# Patient Record
Sex: Male | Born: 1955 | Race: White | Hispanic: No | Marital: Single | Smoking: Current every day smoker
Health system: Southern US, Community
[De-identification: ages and names within clinical notes are randomized; demographics above are authoritative.]

## PROBLEM LIST (undated history)

## (undated) DIAGNOSIS — E119 Type 2 diabetes mellitus without complications: Secondary | ICD-10-CM

## (undated) DIAGNOSIS — Z720 Tobacco use: Secondary | ICD-10-CM

## (undated) HISTORY — PX: CHEST TUBE INSERTION: SHX231

---

## 2013-06-14 DIAGNOSIS — S270XXA Traumatic pneumothorax, initial encounter: Secondary | ICD-10-CM

## 2013-06-14 HISTORY — DX: Traumatic pneumothorax, initial encounter: S27.0XXA

## 2013-08-29 ENCOUNTER — Encounter (HOSPITAL_COMMUNITY): Payer: Self-pay | Admitting: Emergency Medicine

## 2013-08-29 ENCOUNTER — Inpatient Hospital Stay (HOSPITAL_COMMUNITY)
Admission: EM | Admit: 2013-08-29 | Discharge: 2013-09-01 | DRG: 200 | Disposition: A | Discharge status: Home / Self Care | Payer: Self-pay | Attending: General Surgery | Admitting: General Surgery

## 2013-08-29 ENCOUNTER — Emergency Department (HOSPITAL_COMMUNITY): Payer: Self-pay

## 2013-08-29 DIAGNOSIS — E119 Type 2 diabetes mellitus without complications: Secondary | ICD-10-CM | POA: Diagnosis present

## 2013-08-29 DIAGNOSIS — F17200 Nicotine dependence, unspecified, uncomplicated: Secondary | ICD-10-CM | POA: Diagnosis present

## 2013-08-29 DIAGNOSIS — S2239XA Fracture of one rib, unspecified side, initial encounter for closed fracture: Secondary | ICD-10-CM

## 2013-08-29 DIAGNOSIS — S32009A Unspecified fracture of unspecified lumbar vertebra, initial encounter for closed fracture: Secondary | ICD-10-CM | POA: Diagnosis present

## 2013-08-29 DIAGNOSIS — E785 Hyperlipidemia, unspecified: Secondary | ICD-10-CM

## 2013-08-29 DIAGNOSIS — Z720 Tobacco use: Secondary | ICD-10-CM

## 2013-08-29 DIAGNOSIS — W19XXXA Unspecified fall, initial encounter: Secondary | ICD-10-CM | POA: Diagnosis present

## 2013-08-29 DIAGNOSIS — S2249XA Multiple fractures of ribs, unspecified side, initial encounter for closed fracture: Secondary | ICD-10-CM | POA: Diagnosis present

## 2013-08-29 DIAGNOSIS — J930 Spontaneous tension pneumothorax: Secondary | ICD-10-CM

## 2013-08-29 DIAGNOSIS — S270XXA Traumatic pneumothorax, initial encounter: Principal | ICD-10-CM | POA: Diagnosis present

## 2013-08-29 DIAGNOSIS — W1789XA Other fall from one level to another, initial encounter: Secondary | ICD-10-CM | POA: Diagnosis present

## 2013-08-29 DIAGNOSIS — S2242XA Multiple fractures of ribs, left side, initial encounter for closed fracture: Secondary | ICD-10-CM | POA: Diagnosis present

## 2013-08-29 LAB — BASIC METABOLIC PANEL
BUN: 10 mg/dL (ref 6–23)
CHLORIDE: 100 meq/L (ref 96–112)
CO2: 24 mEq/L (ref 19–32)
Calcium: 9.2 mg/dL (ref 8.4–10.5)
Creatinine, Ser: 0.61 mg/dL (ref 0.50–1.35)
Glucose, Bld: 178 mg/dL — ABNORMAL HIGH (ref 70–99)
POTASSIUM: 3.9 meq/L (ref 3.7–5.3)
SODIUM: 139 meq/L (ref 137–147)

## 2013-08-29 LAB — CBC WITH DIFFERENTIAL/PLATELET
BASOS ABS: 0 10*3/uL (ref 0.0–0.1)
Basophils Relative: 0 % (ref 0–1)
EOS ABS: 0.1 10*3/uL (ref 0.0–0.7)
Eosinophils Relative: 1 % (ref 0–5)
HCT: 49.6 % (ref 39.0–52.0)
HEMOGLOBIN: 17.4 g/dL — AB (ref 13.0–17.0)
LYMPHS PCT: 11 % — AB (ref 12–46)
Lymphs Abs: 0.9 10*3/uL (ref 0.7–4.0)
MCH: 31.5 pg (ref 26.0–34.0)
MCHC: 35.1 g/dL (ref 30.0–36.0)
MCV: 89.9 fL (ref 78.0–100.0)
Monocytes Absolute: 0.6 10*3/uL (ref 0.1–1.0)
Monocytes Relative: 7 % (ref 3–12)
Neutro Abs: 6.7 10*3/uL (ref 1.7–7.7)
Neutrophils Relative %: 81 % — ABNORMAL HIGH (ref 43–77)
PLATELETS: ADEQUATE 10*3/uL (ref 150–400)
RBC: 5.52 MIL/uL (ref 4.22–5.81)
RDW: 12.8 % (ref 11.5–15.5)
WBC: 8.3 10*3/uL (ref 4.0–10.5)

## 2013-08-29 LAB — I-STAT CG4 LACTIC ACID, ED: LACTIC ACID, VENOUS: 1.64 mmol/L (ref 0.5–2.2)

## 2013-08-29 MED ORDER — FENTANYL CITRATE 0.05 MG/ML IJ SOLN
100.0000 ug | Freq: Once | INTRAMUSCULAR | Status: AC
  Administered 2013-08-29: 100 ug via INTRAVENOUS
  Administered 2013-08-30 (×2): 50 ug via INTRAVENOUS
  Filled 2013-08-29: qty 2

## 2013-08-29 MED ORDER — FENTANYL CITRATE 0.05 MG/ML IJ SOLN
INTRAMUSCULAR | Status: AC
  Administered 2013-08-30: 50 ug via INTRAVENOUS
  Filled 2013-08-29: qty 2

## 2013-08-29 MED ORDER — HYDROMORPHONE HCL PF 1 MG/ML IJ SOLN
1.0000 mg | Freq: Once | INTRAMUSCULAR | Status: AC
  Administered 2013-08-29: 1 mg via INTRAVENOUS
  Filled 2013-08-29: qty 1

## 2013-08-29 MED ORDER — MIDAZOLAM HCL 2 MG/2ML IJ SOLN
INTRAMUSCULAR | Status: AC
  Administered 2013-08-30: 2 mg
  Filled 2013-08-29: qty 8

## 2013-08-29 NOTE — ED Notes (Signed)
Patient transported to CT 

## 2013-08-29 NOTE — ED Notes (Signed)
PT reports to [facility] HE fell approx 4 ft . And hit back of wood planter. Pt reported to EMS that he hurt to bad to go on LSB ( Pt refused LSB). [facility] gave 250 of fentanyl for pain on arrival to ED Pt reported Pain to be 2/10 . Pt demonstrated full range of motion with bilateral LE. Pulses strong Pedal. Pt denied LOC due to Fall.

## 2013-08-29 NOTE — ED Provider Notes (Signed)
~  2200 I was called  to the patient's room due to consistent pain, and after initial x-rays demonstrated pneumothorax, multiple rib fractures. On my exam the patient was speaking clearly not substantially hypoxic, not hypotensivebut w increased respiratory rate and shallow breathing.  He did complain of ongoing pain and dyspnea - and seemed to be favoring his L side. I ordered CT scans of the patient's injuries for further evaluation.    11:44 PM On re-exam the patient appears comfortable, he is now in the trauma bay, given the evidence of pneumothorax on x-ray and CT. I discussed his case with our trauma surgeon on call, Dr. Corliss Skainssuei, who will assist with the patient's care, including ED chest tube placement.  Vital signs remain similar, with no tachycardia, no hypoxia, no hypotension, though he is mildly tachypneic.  EKG shows sinus rhythm, rate 79, no ischemic changes, normal  Patient returned from CT to Trauma Bay B   12:59 AM Following placement of the chest tube the patient is feeling better.  VS have improved w no more tachypnea. He continues to have pain, but his respirations are easier.        Derek Munchobert Dariyon Urquilla, MD 08/30/13 [phone]

## 2013-08-29 NOTE — ED Provider Notes (Signed)
2000 - Patient care assumed from Roxy Horsemanobert Browning, PA-C at shift change. Patient fell from height of 674ft yesterday off patio at 1PM yesterday. Patient fell on back, hitting back on edges of rectangular vegetable planter. Imaging pending.  2025 - Patient refuses to lay flat while at Xray limiting back films to 2 view only, rather than complete. Xray called to inform me of this. Have requested that radiology tech explain to the patient the potential repercussions of this including, but not limited to permanent disability or injury. Radiology tech verbalizes to me that patient expresses understanding to this effect.  2150 - Radiologist called to report findings on CXR which include multiple posterior L rib fractures with L pneumothorax. Patient with sats of 88% on RA on arrival. Patient is in no acute respiratory distress. No tachycardia. He has been placed on 3L O2 [place] given PTX and O2 sats of 88%; he is now maintaining O2 sats of 93%. CT C/T/L spine and CT chest ordered. Plan includes admission to trauma service.   2335 - CT chest reveals tension PTX, per radiologist reading which was just verbally reported to me. Multiple L rib fractures, again, identified. Dr. Jeraldine LootsLockwood notified who will consult with trauma surgeon.  2345 - Trauma to admit.  Antony MaduraKelly Rashmi Tallent, PA-C 08/29/13 2349  Results for orders placed during the hospital encounter of 08/29/13  CBC WITH DIFFERENTIAL      Result Value Ref Range   WBC 8.3  4.0 - 10.5 K/uL   RBC 5.52  4.22 - 5.81 MIL/uL   Hemoglobin 17.4 (*) 13.0 - 17.0 g/dL   HCT 09.849.6  11.939.0 - 14.752.0 %   MCV 89.9  78.0 - 100.0 fL   MCH 31.5  26.0 - 34.0 pg   MCHC 35.1  30.0 - 36.0 g/dL   RDW 82.912.8  56.211.5 - 13.015.5 %   Platelets    150 - 400 K/uL   Value: PLATELET CLUMPS NOTED ON SMEAR, COUNT APPEARS ADEQUATE   Neutrophils Relative % 81 (*) 43 - 77 %   Lymphocytes Relative 11 (*) 12 - 46 %   Monocytes Relative 7  3 - 12 %   Eosinophils Relative 1  0 - 5 %   Basophils Relative 0  0 - 1  %   Neutro Abs 6.7  1.7 - 7.7 K/uL   Lymphs Abs 0.9  0.7 - 4.0 K/uL   Monocytes Absolute 0.6  0.1 - 1.0 K/uL   Eosinophils Absolute 0.1  0.0 - 0.7 K/uL   Basophils Absolute 0.0  0.0 - 0.1 K/uL  BASIC METABOLIC PANEL      Result Value Ref Range   Sodium 139  137 - 147 mEq/L   Potassium 3.9  3.7 - 5.3 mEq/L   Chloride 100  96 - 112 mEq/L   CO2 24  19 - 32 mEq/L   Glucose, Bld 178 (*) 70 - 99 mg/dL   BUN 10  6 - 23 mg/dL   Creatinine, Ser 8.650.61  0.50 - 1.35 mg/dL   Calcium 9.2  8.4 - 78.410.5 mg/dL   GFR calc non Af Amer >90  >90 mL/min   GFR calc Af Amer >90  >90 mL/min  I-STAT CG4 LACTIC ACID, ED      Result Value Ref Range   Lactic Acid, Venous 1.64  0.5 - 2.2 mmol/L   Dg Ribs Unilateral W/chest Left  08/29/2013   CLINICAL DATA:  Fall.  EXAM: LEFT RIBS AND CHEST - 3+ VIEW  COMPARISON:  None.  FINDINGS: Multiple left posterior rib fractures are noted with a prominent left pneumothorax.  IMPRESSION: Multiple posterior left rib fractures with prominent left-sided pneumothorax. These results were called by telephone at the time of interpretation on 08/29/2013 at 9:45 PM to [facility] , who verbally acknowledged these results.   Electronically Signed   By: Maisie Fus  Register   On: 08/29/2013 21:46   Dg Cervical Spine 1 View  08/29/2013   CLINICAL DATA:  Fall.  EXAM: DG CERVICAL SPINE - 1 VIEW  COMPARISON:  None.  FINDINGS: Single cross-table lateral view of the cervical spine demonstrates only to the C4 level. Cannot visualize below this level due to overlying shoulders. Alignment in the upper cervical spine is normal. Prevertebral soft tissues normal.  IMPRESSION: Severely limited clearing view, demonstrating to the superior aspect of C4. The remainder of the cervical spine is obscured due to overlying shoulders.   Electronically Signed   By: Charlett Nose M.D.   On: 08/29/2013 21:41   Dg Thoracic Spine 2 View  08/29/2013   CLINICAL DATA:  Fall.  EXAM: THORACIC SPINE - 2 VIEW  COMPARISON:  Diffuse  degenerative changes thoracic spine. No acute abnormality.  FINDINGS: There is no evidence of thoracic spine fracture. Alignment is normal. No other significant bone abnormalities are identified.  IMPRESSION: Diffuse degenerative change thoracic spine, no acute abnormality.   Electronically Signed   By: Maisie Fus  Register   On: 08/29/2013 21:41   Dg Lumbar Spine 2-3 Views  08/29/2013   CLINICAL DATA:  Fall.  EXAM: LUMBAR SPINE - 2-3 VIEW  COMPARISON:  No prior.  FINDINGS: Diffuse degenerative changes of the lumbar spine. Mild compression L3 and L4 noted. Mild compression L1 noted. Age of compressions undetermined. MRI can be obtained for further evaluation.  IMPRESSION: Multiple mild lumbar compression fractures, age undetermined. Further evaluation with lumbar MRI can be obtained.   Electronically Signed   By: Maisie Fus  Register   On: 08/29/2013 21:43   Ct Chest Wo Contrast  08/29/2013   CLINICAL DATA:  Trauma.  EXAM: CT CHEST WITHOUT CONTRAST  TECHNIQUE: Multidetector CT imaging of the chest was performed following the standard protocol without IV contrast.  COMPARISON:  None.  FINDINGS: Thoracic aorta normal caliber. Heart size normal. Coronary artery disease.  No mediastinal mass.  Thoracic esophagus normal.  Large airways are patent. Tension pneumothorax is present on the left. The pneumothorax is prominent. Multiple left rib fractures are present. These fractures are slightly displaced.  Adrenals are normal. Gallstones appear to be present. Thyroid unremarkable. No significant axillary adenopathy. Again noted are multiple left rib fractures.  IMPRESSION: 1. Prominent tension pneumothorax on the left. Critical Value/emergent results were called by telephone at the time of interpretation on 08/29/2013 at 11:37 PM to Dr. Antony Madura , who verbally acknowledged these results. 2. Multiple displaced left rib fractures.   Electronically Signed   By: Maisie Fus  Register   On: 08/29/2013 23:37      Antony Madura,  PA-C 08/29/13 2352

## 2013-08-29 NOTE — ED Notes (Signed)
Patient transported to X-ray 

## 2013-08-29 NOTE — ED Notes (Signed)
Patient to room from CT.  Fell on Tuesday and has been SOB and having pain on the left side of his back.  Collar remains on.

## 2013-08-29 NOTE — ED Notes (Signed)
Placed on 2L [place] to support breathing.

## 2013-08-29 NOTE — ED Notes (Signed)
PA at bedside.

## 2013-08-29 NOTE — ED Provider Notes (Signed)
CSN: [id number]     Arrival date & time 08/29/13  1709 History   First MD Initiated Contact with Patient 08/29/13 1716     Chief Complaint  Patient presents with  . Fall  . Back Pain     (Consider location/radiation/quality/duration/timing/severity/associated sxs/prior Treatment) HPI Comments: Patient presents to the emergency department with chief complaint of fall and back pain. He states that he was working yesterday, when he fell approximately 4 feet, and hit his back on a 2 x 4. He states that he rested last night, but the pain is persistent. States the pain is 10 out of 10, but after receiving fentanyl from EMS, his pain improved to 2/10. He denies weakness or numbness in his arms or legs. He denies any bowel or bladder incontinence. He states that the pain is worsened with movement, and that he feels best if he lies on his side.  The history is provided by the patient. No language interpreter was used.    History reviewed. No pertinent past medical history. History reviewed. No pertinent past surgical history. No family history on file. History  Substance Use Topics  . Smoking status: Never Smoker   . Smokeless tobacco: Not on file  . Alcohol Use: No    Review of Systems  All other systems reviewed and are negative.      Allergies  Review of patient's allergies indicates not on file.  Home Medications   Current Outpatient Rx  Name  Route  Sig  Dispense  Refill  . aspirin 325 MG tablet   Oral   Take 325 mg by mouth daily as needed for mild pain.         . Calcium Carbonate Antacid (ANTACID PO)   Oral   Take 1 tablet by mouth every 4 (four) hours as needed (acid reflux).         . phenylephrine (NEO-SYNEPHRINE) 1 % nasal spray   Each Nare   Place 2 drops into both nostrils every 6 (six) hours as needed for congestion.          BP 105/73  Pulse 79  Temp(Src) 99.5 F (37.5 C) (Oral)  Resp 20  SpO2 95% Physical Exam  Nursing note and vitals  reviewed. Constitutional: He is oriented to person, place, and time. He appears well-developed and well-nourished.  Patient in c-collar  HENT:  Head: Normocephalic and atraumatic.  Eyes: Conjunctivae and EOM are normal. Pupils are equal, round, and reactive to light. Right eye exhibits no discharge. Left eye exhibits no discharge. No scleral icterus.  Neck: Normal range of motion. Neck supple. No JVD present.  Cardiovascular: Normal rate, regular rhythm and normal heart sounds.  Exam reveals no gallop and no friction rub.   No murmur heard. Pulmonary/Chest: Effort normal and breath sounds normal. No respiratory distress. He has no wheezes. He has no rales. He exhibits no tenderness.  Abdominal: Soft. He exhibits no distension and no mass. There is no tenderness. There is no rebound and no guarding.  Musculoskeletal: Normal range of motion. He exhibits no edema and no tenderness.  Thoracic spine and left sided inferior ribs are tender to palpation, no bony abnormality or deformity, no step-offs, no acute abnormality of the spine  Neurological: He is alert and oriented to person, place, and time.  Skin: Skin is warm and dry.  Psychiatric: He has a normal mood and affect. His behavior is normal. Judgment and thought content normal.    ED Course  Procedures (including  critical care time) Labs Review Labs Reviewed - No data to display Imaging Review No results found.   EKG Interpretation None      MDM   Final diagnoses:  Tension pneumothorax  Fracture, ribs  Fall    Patient with fall and back pain since yesterday. Check plain films. Treat pain. Patient is currently in a c-collar from EMS.  2000 Patient signed out to oncoming provider, who will continue care.  Plan:  Follow-up on imaging.    Roxy Horseman, PA-C 08/30/13 1101

## 2013-08-30 ENCOUNTER — Encounter (HOSPITAL_COMMUNITY): Payer: Self-pay | Admitting: Certified Registered"

## 2013-08-30 ENCOUNTER — Emergency Department (HOSPITAL_COMMUNITY): Payer: Self-pay

## 2013-08-30 DIAGNOSIS — S22009A Unspecified fracture of unspecified thoracic vertebra, initial encounter for closed fracture: Secondary | ICD-10-CM

## 2013-08-30 DIAGNOSIS — S2249XA Multiple fractures of ribs, unspecified side, initial encounter for closed fracture: Secondary | ICD-10-CM

## 2013-08-30 DIAGNOSIS — S270XXA Traumatic pneumothorax, initial encounter: Secondary | ICD-10-CM

## 2013-08-30 DIAGNOSIS — S32009A Unspecified fracture of unspecified lumbar vertebra, initial encounter for closed fracture: Secondary | ICD-10-CM

## 2013-08-30 LAB — URINALYSIS, ROUTINE W REFLEX MICROSCOPIC
Bilirubin Urine: NEGATIVE
Glucose, UA: 250 mg/dL — AB
Hgb urine dipstick: NEGATIVE
Ketones, ur: 15 mg/dL — AB
LEUKOCYTES UA: NEGATIVE
Nitrite: NEGATIVE
Protein, ur: NEGATIVE mg/dL
Specific Gravity, Urine: 1.026 (ref 1.005–1.030)
Urobilinogen, UA: 0.2 mg/dL (ref 0.0–1.0)
pH: 5 (ref 5.0–8.0)

## 2013-08-30 LAB — MRSA PCR SCREENING: MRSA by PCR: NEGATIVE

## 2013-08-30 LAB — GLUCOSE, CAPILLARY
Glucose-Capillary: 155 mg/dL — ABNORMAL HIGH (ref 70–99)
Glucose-Capillary: 134 mg/dL — ABNORMAL HIGH (ref 70–99)

## 2013-08-30 LAB — BASIC METABOLIC PANEL
BUN: 11 mg/dL (ref 6–23)
CO2: 28 mEq/L (ref 19–32)
CREATININE: 0.77 mg/dL (ref 0.50–1.35)
Calcium: 8.9 mg/dL (ref 8.4–10.5)
Chloride: 101 mEq/L (ref 96–112)
GFR calc Af Amer: >90 mL/min (ref >90)
Glucose, Bld: 178 mg/dL — ABNORMAL HIGH (ref 70–99)
POTASSIUM: 4.2 meq/L (ref 3.7–5.3)
Sodium: 140 mEq/L (ref 137–147)

## 2013-08-30 LAB — CBC
HCT: 46.4 % (ref 39.0–52.0)
Hemoglobin: 15.9 g/dL (ref 13.0–17.0)
MCH: 31.4 pg (ref 26.0–34.0)
MCHC: 34.3 g/dL (ref 30.0–36.0)
MCV: 91.7 fL (ref 78.0–100.0)
Platelets: 176 10*3/uL (ref 150–400)
RBC: 5.06 MIL/uL (ref 4.22–5.81)
RDW: 13 % (ref 11.5–15.5)
WBC: 10.4 10*3/uL (ref 4.0–10.5)

## 2013-08-30 LAB — HEMOGLOBIN A1C
Hgb A1c MFr Bld: 7.9 % — ABNORMAL HIGH (ref <5.7)
Mean Plasma Glucose: 180 mg/dL — ABNORMAL HIGH (ref <117)

## 2013-08-30 MED ORDER — ONDANSETRON HCL 4 MG PO TABS: 4.0000 mg | ORAL_TABLET | Freq: Four times a day (QID) | ORAL | Status: DC | PRN

## 2013-08-30 MED ORDER — PANTOPRAZOLE SODIUM 40 MG IV SOLR
40.0000 mg | Freq: Every day | INTRAVENOUS | Status: DC
  Filled 2013-08-30 (×2): qty 40

## 2013-08-30 MED ORDER — LACTATED RINGERS IV SOLN
INTRAVENOUS | Status: DC
  Administered 2013-08-30: 1000 mL via INTRAVENOUS
  Administered 2013-08-30 – 2013-08-31 (×2): via INTRAVENOUS

## 2013-08-30 MED ORDER — HYDROMORPHONE HCL PF 1 MG/ML IJ SOLN
INTRAMUSCULAR | Status: AC
  Filled 2013-08-30: qty 1

## 2013-08-30 MED ORDER — HYDROMORPHONE HCL PF 1 MG/ML IJ SOLN
1.0000 mg | INTRAMUSCULAR | Status: DC | PRN
  Administered 2013-08-30 (×4): 1 mg via INTRAVENOUS
  Filled 2013-08-30 (×3): qty 1

## 2013-08-30 MED ORDER — KETOROLAC TROMETHAMINE 30 MG/ML IJ SOLN
30.0000 mg | Freq: Once | INTRAMUSCULAR | Status: AC
  Administered 2013-08-30: 30 mg via INTRAVENOUS
  Filled 2013-08-30: qty 1

## 2013-08-30 MED ORDER — METHOCARBAMOL 750 MG PO TABS
750.0000 mg | ORAL_TABLET | Freq: Three times a day (TID) | ORAL | Status: DC
  Administered 2013-08-30 – 2013-08-31 (×6): 750 mg via ORAL
  Filled 2013-08-30 (×10): qty 1

## 2013-08-30 MED ORDER — PANTOPRAZOLE SODIUM 40 MG PO TBEC
40.0000 mg | DELAYED_RELEASE_TABLET | Freq: Every day | ORAL | Status: DC
  Administered 2013-08-30: 40 mg via ORAL
  Filled 2013-08-30 (×2): qty 1

## 2013-08-30 MED ORDER — KETOROLAC TROMETHAMINE 15 MG/ML IJ SOLN
15.0000 mg | Freq: Four times a day (QID) | INTRAMUSCULAR | Status: DC
  Administered 2013-08-30 – 2013-08-31 (×4): 15 mg via INTRAVENOUS
  Filled 2013-08-30 (×7): qty 1

## 2013-08-30 MED ORDER — HYDROMORPHONE HCL PF 1 MG/ML IJ SOLN
1.0000 mg | INTRAMUSCULAR | Status: DC | PRN
  Administered 2013-08-30: 1 mg via INTRAVENOUS
  Administered 2013-08-30: 2 mg via INTRAVENOUS
  Administered 2013-08-30: 1 mg via INTRAVENOUS
  Administered 2013-08-30: 2 mg via INTRAVENOUS
  Administered 2013-08-30: 1 mg via INTRAVENOUS
  Administered 2013-08-31: 2 mg via INTRAVENOUS
  Administered 2013-08-31: 1 mg via INTRAVENOUS
  Administered 2013-08-31: 2 mg via INTRAVENOUS
  Administered 2013-08-31 (×2): 1 mg via INTRAVENOUS
  Filled 2013-08-30 (×2): qty 1
  Filled 2013-08-30: qty 2
  Filled 2013-08-30: qty 1
  Filled 2013-08-30 (×2): qty 2
  Filled 2013-08-30 (×3): qty 1
  Filled 2013-08-30: qty 2

## 2013-08-30 MED ORDER — INSULIN ASPART 100 UNIT/ML ~~LOC~~ SOLN
0.0000 [IU] | Freq: Three times a day (TID) | SUBCUTANEOUS | Status: DC
  Administered 2013-08-30: 2 [IU] via SUBCUTANEOUS
  Administered 2013-08-30: 1 [IU] via SUBCUTANEOUS
  Administered 2013-08-31: 2 [IU] via SUBCUTANEOUS
  Administered 2013-08-31 – 2013-09-01 (×4): 1 [IU] via SUBCUTANEOUS

## 2013-08-30 MED ORDER — DOCUSATE SODIUM 100 MG PO CAPS
100.0000 mg | ORAL_CAPSULE | Freq: Two times a day (BID) | ORAL | Status: DC
  Administered 2013-08-30 – 2013-09-01 (×4): 100 mg via ORAL
  Filled 2013-08-30 (×5): qty 1

## 2013-08-30 MED ORDER — ONDANSETRON HCL 4 MG/2ML IJ SOLN: 4.0000 mg | Freq: Four times a day (QID) | INTRAMUSCULAR | Status: DC | PRN

## 2013-08-30 NOTE — ED Provider Notes (Signed)
Medical screening examination/treatment/procedure(s) were performed by non-physician practitioner and as supervising physician I was immediately available for consultation/collaboration.   EKG Interpretation None       Flint MelterElliott L Samaria Anes, MD 08/30/13 1510

## 2013-08-30 NOTE — ED Notes (Signed)
Collar removed by Dr Corliss Skainssuei

## 2013-08-30 NOTE — Progress Notes (Signed)
UR completed.  Ranae Casebier, RN BSN [facility] CCM Trauma/Neuro ICU Case Manager [phone]  

## 2013-08-30 NOTE — ED Provider Notes (Signed)
House available for consult during the completion of the patient's course.  I have seen, evaluated the patient.  Please see my additional note.  Derek Munchobert Shaunessy Dobratz, MD 08/30/13 [phone]

## 2013-08-30 NOTE — Progress Notes (Addendum)
Trauma Service Note  Subjective: Patient with lots of concerns and questions.  No acute distress but unwilling to be very aggressive with care.  Does not want to gt out of bed.  Objective: Vital signs in last 24 hours: Temp:  [98.2 F (36.8 C)-99.5 F (37.5 C)] 98.2 F (36.8 C) (03/19 0746) Pulse Rate:  [71-103] 83 (03/19 0804) Resp:  [12-24] 19 (03/19 0804) BP: (105-156)/(61-105) 131/91 mmHg (03/19 0800) SpO2:  [87 %-96 %] 92 % (03/19 0804) Weight:  [105.4 kg (232 lb 5.8 oz)] 105.4 kg (232 lb 5.8 oz) (03/19 0200)    Intake/Output from previous day: 03/18 0701 - 03/19 0700 In: 400 [I.V.:400] Out: 325 [Urine:325] Intake/Output this shift:    General: No acute distress when not moving.  Lungs: Clear.  CXR shows complete expansion of left lung.  No air leakage.  Minimal drainage.  Bibasilar atelectasis.  Abd: Soft, good bowel sounds.  Extremities: No clinical signs or symptoms of DVT  Neuro: Intact  Lab Results: CBC   Recent Labs  08/29/13 2216 08/30/13 0305  WBC 8.3 10.4  HGB 17.4* 15.9  HCT 49.6 46.4  PLT PLATELET CLUMPS NOTED ON SMEAR, COUNT APPEARS ADEQUATE 176   BMET  Recent Labs  08/29/13 2216 08/30/13 0305  NA 139 140  K 3.9 4.2  CL 100 101  CO2 24 28  GLUCOSE 178* 178*  BUN 10 11  CREATININE 0.61 0.77  CALCIUM 9.2 8.9   PT/INR No results found for this basename: LABPROT, INR,  in the last 72 hours ABG No results found for this basename: PHART, PCO2, PO2, HCO3,  in the last 72 hours  Studies/Results: Dg Ribs Unilateral W/chest Left  08/29/2013   CLINICAL DATA:  Fall.  EXAM: LEFT RIBS AND CHEST - 3+ VIEW  COMPARISON:  None.  FINDINGS: Multiple left posterior rib fractures are noted with a prominent left pneumothorax.  IMPRESSION: Multiple posterior left rib fractures with prominent left-sided pneumothorax. These results were called by telephone at the time of interpretation on 08/29/2013 at 9:45 PM to [facility] , who verbally acknowledged  these results.   Electronically Signed   By: Maisie Fus  Register   On: 08/29/2013 21:46   Dg Cervical Spine 1 View  08/29/2013   CLINICAL DATA:  Fall.  EXAM: DG CERVICAL SPINE - 1 VIEW  COMPARISON:  None.  FINDINGS: Single cross-table lateral view of the cervical spine demonstrates only to the C4 level. Cannot visualize below this level due to overlying shoulders. Alignment in the upper cervical spine is normal. Prevertebral soft tissues normal.  IMPRESSION: Severely limited clearing view, demonstrating to the superior aspect of C4. The remainder of the cervical spine is obscured due to overlying shoulders.   Electronically Signed   By: Charlett Nose M.D.   On: 08/29/2013 21:41   Dg Thoracic Spine 2 View  08/29/2013   CLINICAL DATA:  Fall.  EXAM: THORACIC SPINE - 2 VIEW  COMPARISON:  Diffuse degenerative changes thoracic spine. No acute abnormality.  FINDINGS: There is no evidence of thoracic spine fracture. Alignment is normal. No other significant bone abnormalities are identified.  IMPRESSION: Diffuse degenerative change thoracic spine, no acute abnormality.   Electronically Signed   By: Maisie Fus  Register   On: 08/29/2013 21:41   Dg Lumbar Spine 2-3 Views  08/29/2013   CLINICAL DATA:  Fall.  EXAM: LUMBAR SPINE - 2-3 VIEW  COMPARISON:  No prior.  FINDINGS: Diffuse degenerative changes of the lumbar spine. Mild compression L3 and  L4 noted. Mild compression L1 noted. Age of compressions undetermined. MRI can be obtained for further evaluation.  IMPRESSION: Multiple mild lumbar compression fractures, age undetermined. Further evaluation with lumbar MRI can be obtained.   Electronically Signed   By: Maisie Fus  Register   On: 08/29/2013 21:43   Ct Chest Wo Contrast  08/29/2013   CLINICAL DATA:  Trauma.  EXAM: CT CHEST WITHOUT CONTRAST  TECHNIQUE: Multidetector CT imaging of the chest was performed following the standard protocol without IV contrast.  COMPARISON:  None.  FINDINGS: Thoracic aorta normal caliber.  Heart size normal. Coronary artery disease.  No mediastinal mass.  Thoracic esophagus normal.  Large airways are patent. Tension pneumothorax is present on the left. The pneumothorax is prominent. Multiple left rib fractures are present. These fractures are slightly displaced.  Adrenals are normal. Gallstones appear to be present. Thyroid unremarkable. No significant axillary adenopathy. Again noted are multiple left rib fractures.  IMPRESSION: 1. Prominent tension pneumothorax on the left. Critical Value/emergent results were called by telephone at the time of interpretation on 08/29/2013 at 11:37 PM to Dr. Antony Madura , who verbally acknowledged these results. 2. Multiple displaced left rib fractures.   Electronically Signed   By: Maisie Fus  Register   On: 08/29/2013 23:37   Ct Cervical Spine Wo Contrast  08/29/2013   CLINICAL DATA:  Fall.  EXAM: CT CERVICAL, THORACIC, AND LUMBAR SPINE WITHOUT CONTRAST  TECHNIQUE: Multidetector CT imaging of the cervical, lumbar, and thoracic spine was performed with intravenous contrast. Multiplanar CT image reconstructions were also generated.  FINDINGS: CT CERVICAL SPINE FINDINGS  Severe degenerative changes noted of the cervical spine with multilevel disc degeneration and endplate osteophyte formation. There is no evidence of fracture or dislocation. As noted on chest CT report pneumothorax is present on the left.  CT THORACIC SPINE FINDINGS  Diffuse degenerative changes and osteopenia and thoracic spine. Minimal compressions are noted. These may be old. No evidence of retropulsed fragments. No displaced fractures are noted.  CT LUMBAR SPINE FINDINGS  Mild lumbar compression fractures are noted L1, L3, and L4. Age undetermined. No retropulsed prominent fragments are noted. No bony spinal stenosis.  Again noted is a tension pneumothorax on the left with multiple left rib fractures. Atelectatic changes on the right with of bilateral pleural effusion present. Right nondisplaced  rib fractures cannot be excluded.  : IMPRESSION:  1. Severe degenerative changes cervical spine. No evidence of acute fracture. 2. Pneumothorax on the left.  Reference is made to chest CT report. 3. Multiple minimal thoracic spine compression, these may be old. No evidence of retropulsed fragments. 4. Mild lumbar compression fractures, L1, L3, and L4. Age undetermined. No prominent retropulsed fragments noted.   Electronically Signed   By: Maisie Fus  Register   On: 08/29/2013 23:41   Ct Thoracic Spine Wo Contrast  08/29/2013   CLINICAL DATA:  Fall.  EXAM: CT CERVICAL, THORACIC, AND LUMBAR SPINE WITHOUT CONTRAST  TECHNIQUE: Multidetector CT imaging of the cervical, lumbar, and thoracic spine was performed with intravenous contrast. Multiplanar CT image reconstructions were also generated.  FINDINGS: CT CERVICAL SPINE FINDINGS  Severe degenerative changes noted of the cervical spine with multilevel disc degeneration and endplate osteophyte formation. There is no evidence of fracture or dislocation. As noted on chest CT report pneumothorax is present on the left.  CT THORACIC SPINE FINDINGS  Diffuse degenerative changes and osteopenia and thoracic spine. Minimal compressions are noted. These may be old. No evidence of retropulsed fragments. No displaced fractures are  noted.  CT LUMBAR SPINE FINDINGS  Mild lumbar compression fractures are noted L1, L3, and L4. Age undetermined. No retropulsed prominent fragments are noted. No bony spinal stenosis.  Again noted is a tension pneumothorax on the left with multiple left rib fractures. Atelectatic changes on the right with of bilateral pleural effusion present. Right nondisplaced rib fractures cannot be excluded.  : IMPRESSION:  1. Severe degenerative changes cervical spine. No evidence of acute fracture. 2. Pneumothorax on the left.  Reference is made to chest CT report. 3. Multiple minimal thoracic spine compression, these may be old. No evidence of retropulsed fragments.  4. Mild lumbar compression fractures, L1, L3, and L4. Age undetermined. No prominent retropulsed fragments noted.   Electronically Signed   By: Maisie Fushomas  Register   On: 08/29/2013 23:41   Ct Lumbar Spine Wo Contrast  08/29/2013   CLINICAL DATA:  Fall.  EXAM: CT CERVICAL, THORACIC, AND LUMBAR SPINE WITHOUT CONTRAST  TECHNIQUE: Multidetector CT imaging of the cervical, lumbar, and thoracic spine was performed with intravenous contrast. Multiplanar CT image reconstructions were also generated.  FINDINGS: CT CERVICAL SPINE FINDINGS  Severe degenerative changes noted of the cervical spine with multilevel disc degeneration and endplate osteophyte formation. There is no evidence of fracture or dislocation. As noted on chest CT report pneumothorax is present on the left.  CT THORACIC SPINE FINDINGS  Diffuse degenerative changes and osteopenia and thoracic spine. Minimal compressions are noted. These may be old. No evidence of retropulsed fragments. No displaced fractures are noted.  CT LUMBAR SPINE FINDINGS  Mild lumbar compression fractures are noted L1, L3, and L4. Age undetermined. No retropulsed prominent fragments are noted. No bony spinal stenosis.  Again noted is a tension pneumothorax on the left with multiple left rib fractures. Atelectatic changes on the right with of bilateral pleural effusion present. Right nondisplaced rib fractures cannot be excluded.  : IMPRESSION:  1. Severe degenerative changes cervical spine. No evidence of acute fracture. 2. Pneumothorax on the left.  Reference is made to chest CT report. 3. Multiple minimal thoracic spine compression, these may be old. No evidence of retropulsed fragments. 4. Mild lumbar compression fractures, L1, L3, and L4. Age undetermined. No prominent retropulsed fragments noted.   Electronically Signed   By: Maisie Fushomas  Register   On: 08/29/2013 23:41   Dg Chest Portable 1 View  08/30/2013   CLINICAL DATA:  Left chest tube.  EXAM: PORTABLE CHEST - 1 VIEW   COMPARISON:  None.  FINDINGS: Mediastinum unremarkable. Mild cardiomegaly, no pulmonary venous congestion. Bibasilar atelectasis present. No focal pulmonary infiltrate. Previously identified with a tension pneumothorax on left is been resolved. Chest tubes in good anatomic position. Multiple left rib fractures are again noted.  IMPRESSION: 1. Interim resolution of tension pneumothorax on the left. Left chest tube in good anatomic position. 2. Multiple left rib fractures again noted. 3. Mild bibasilar atelectasis.   Electronically Signed   By: Maisie Fushomas  Register   On: 08/30/2013 00:27    Anti-infectives: Anti-infectives   None      Assessment/Plan: s/p  Advance diet Get OOB with PT assistance. CXR tomorrow. Better pain control.  Patient has adamantly refused an epidural catheter.  LOS: 1 day   Marta LamasJames O. Gae BonWyatt, III, MD, FACS [phone] Trauma Surgeon 08/30/2013

## 2013-08-30 NOTE — Op Note (Signed)
Pre-op Diagnosis:  Left pneumothorax Post-op Diagnosis:  Same Procedure:  Left tube thoracostomy - 32 Fr. Surgeon:  Wynona LunaSUEI,Shan Valdes K. Anesthesia:  Conscious sedation - 4 mg Versed, 100 mcg Fentanyl Local anesthetic 1% lidocaine Indications:  Fall from height with multiple rib fractures and large left pneumothorax. Description of procedure:  Positioned on stretcher with left arm up.  Prepped left chest with Chloraprep.  Anesthetized with 1% lidocaine after giving conscious sedation with appropriate monitoring.  3 cm incision at 4th intercostal space.  I dissected down to the chest wall and entered the pleural cavity over the 4th rib.  There was a large rush of air, but no blood.  I inserted a 32Fr chest tube into the pleural space, directed superiorly and posteriorly.  The tube was connected to 20 cm H2O suction.  Secured with 0 Silk sutures and tape.  Dressing applied.  Chest x-ray showed expanded lung with tube in good position.  Wilmon ArmsMatthew K. Corliss Skainssuei, MD, [facility]  General/ Trauma Surgery  08/30/2013 12:32 AM

## 2013-08-30 NOTE — ED Notes (Signed)
Dr. Corliss Skainssuei on to insert chest tube

## 2013-08-30 NOTE — ED Notes (Signed)
Chest tube insertion completed.  Dr Corliss Skainssuei dressed site.  Pleura vac to suction.

## 2013-08-30 NOTE — H&P (Signed)
History   Derek [name] is an 58 y.o. male.   Chief Complaint:  Chief Complaint  Patient presents with  . Fall  . Back Pain    Fall Associated symptoms include chest pain. Pertinent negatives include no abdominal pain, coughing, headaches, myalgias, nausea, neck pain or vomiting.  Back Pain Associated symptoms: chest pain   Associated symptoms: no abdominal pain, no dysuria, no headaches, no tingling and no weight loss   58 yo male presents about 30 hours s/p an accident in which he fell backwards off of a concrete planter about 4 feet in height, landing on the left side of his back on a 2x4 piece of wood.  He complains of back and posterior chest pain.  He spoke to his chiropractor today, who urged him to come to the ED for evaluation.  He denies any weakness or numbness in any extremities.  No incontinence.  He denies any significant shortness of breath.  Trauma was called to evaluate the patient after his CT scans were complete.  The patient does not ever go to the doctor or the dentist.  History reviewed. No pertinent past medical history.  History reviewed. No pertinent past surgical history.  No family history on file. Social History:  reports that he has never smoked. He does not have any smokeless tobacco history on file. He reports that he does not drink alcohol or use illicit drugs.  Allergies  Not on File  Home Medications   Prior to Admission medications   Medication Sig Start Date End Date Taking? Authorizing Provider  aspirin 325 MG tablet Take 325 mg by mouth daily as needed for mild pain.   Yes Historical Provider, MD  Calcium Carbonate Antacid (ANTACID PO) Take 1 tablet by mouth every 4 (four) hours as needed (acid reflux).   Yes Historical Provider, MD  phenylephrine (NEO-SYNEPHRINE) 1 % nasal spray Place 2 drops into both nostrils every 6 (six) hours as needed for congestion.   Yes Historical Provider, MD    Trauma Course   Results for orders placed during  the hospital encounter of 08/29/13 (from the past 48 hour(s))  CBC WITH DIFFERENTIAL     Status: Abnormal   Collection Time    08/29/13 10:16 PM      Result Value Ref Range   WBC 8.3  4.0 - 10.5 K/uL   Comment: REPEATED TO VERIFY     WHITE COUNT CONFIRMED ON SMEAR   RBC 5.52  4.22 - 5.81 MIL/uL   Hemoglobin 17.4 (*) 13.0 - 17.0 g/dL   HCT 49.6  39.0 - 52.0 %   MCV 89.9  78.0 - 100.0 fL   MCH 31.5  26.0 - 34.0 pg   MCHC 35.1  30.0 - 36.0 g/dL   RDW 12.8  11.5 - 15.5 %   Platelets    150 - 400 K/uL   Value: PLATELET CLUMPS NOTED ON SMEAR, COUNT APPEARS ADEQUATE   Neutrophils Relative % 81 (*) 43 - 77 %   Lymphocytes Relative 11 (*) 12 - 46 %   Monocytes Relative 7  3 - 12 %   Eosinophils Relative 1  0 - 5 %   Basophils Relative 0  0 - 1 %   Neutro Abs 6.7  1.7 - 7.7 K/uL   Lymphs Abs 0.9  0.7 - 4.0 K/uL   Monocytes Absolute 0.6  0.1 - 1.0 K/uL   Eosinophils Absolute 0.1  0.0 - 0.7 K/uL   Basophils Absolute 0.0  0.0 - 0.1 K/uL  BASIC METABOLIC PANEL     Status: Abnormal   Collection Time    08/29/13 10:16 PM      Result Value Ref Range   Sodium 139  137 - 147 mEq/L   Potassium 3.9  3.7 - 5.3 mEq/L   Chloride 100  96 - 112 mEq/L   CO2 24  19 - 32 mEq/L   Glucose, Bld 178 (*) 70 - 99 mg/dL   BUN 10  6 - 23 mg/dL   Creatinine, Ser 0.61  0.50 - 1.35 mg/dL   Calcium 9.2  8.4 - 10.5 mg/dL   GFR calc non Af Amer >90  >90 mL/min   GFR calc Af Amer >90  >90 mL/min   Comment: (NOTE)     The eGFR has been calculated using the CKD EPI equation.     This calculation has not been validated in all clinical situations.     eGFR's persistently <90 mL/min signify possible Chronic Kidney     Disease.  I-STAT CG4 LACTIC ACID, ED     Status: None   Collection Time    08/29/13 10:21 PM      Result Value Ref Range   Lactic Acid, Venous 1.64  0.5 - 2.2 mmol/L   Dg Ribs Unilateral W/chest Left  08/29/2013   CLINICAL DATA:  Fall.  EXAM: LEFT RIBS AND CHEST - 3+ VIEW  COMPARISON:  None.   FINDINGS: Multiple left posterior rib fractures are noted with a prominent left pneumothorax.  IMPRESSION: Multiple posterior left rib fractures with prominent left-sided pneumothorax. These results were called by telephone at the time of interpretation on 08/29/2013 at 9:45 PM to [facility] , who verbally acknowledged these results.   Electronically Signed   By: Marcello Moores  Register   On: 08/29/2013 21:46   Dg Cervical Spine 1 View  08/29/2013   CLINICAL DATA:  Fall.  EXAM: DG CERVICAL SPINE - 1 VIEW  COMPARISON:  None.  FINDINGS: Single cross-table lateral view of the cervical spine demonstrates only to the C4 level. Cannot visualize below this level due to overlying shoulders. Alignment in the upper cervical spine is normal. Prevertebral soft tissues normal.  IMPRESSION: Severely limited clearing view, demonstrating to the superior aspect of C4. The remainder of the cervical spine is obscured due to overlying shoulders.   Electronically Signed   By: Rolm Baptise M.D.   On: 08/29/2013 21:41   Dg Thoracic Spine 2 View  08/29/2013   CLINICAL DATA:  Fall.  EXAM: THORACIC SPINE - 2 VIEW  COMPARISON:  Diffuse degenerative changes thoracic spine. No acute abnormality.  FINDINGS: There is no evidence of thoracic spine fracture. Alignment is normal. No other significant bone abnormalities are identified.  IMPRESSION: Diffuse degenerative change thoracic spine, no acute abnormality.   Electronically Signed   By: Marcello Moores  Register   On: 08/29/2013 21:41   Dg Lumbar Spine 2-3 Views  08/29/2013   CLINICAL DATA:  Fall.  EXAM: LUMBAR SPINE - 2-3 VIEW  COMPARISON:  No prior.  FINDINGS: Diffuse degenerative changes of the lumbar spine. Mild compression L3 and L4 noted. Mild compression L1 noted. Age of compressions undetermined. MRI can be obtained for further evaluation.  IMPRESSION: Multiple mild lumbar compression fractures, age undetermined. Further evaluation with lumbar MRI can be obtained.   Electronically Signed    By: Marcello Moores  Register   On: 08/29/2013 21:43   Ct Chest Wo Contrast  08/29/2013   CLINICAL DATA:  Trauma.  EXAM: CT CHEST WITHOUT CONTRAST  TECHNIQUE: Multidetector CT imaging of the chest was performed following the standard protocol without IV contrast.  COMPARISON:  None.  FINDINGS: Thoracic aorta normal caliber. Heart size normal. Coronary artery disease.  No mediastinal mass.  Thoracic esophagus normal.  Large airways are patent. Tension pneumothorax is present on the left. The pneumothorax is prominent. Multiple left rib fractures are present. These fractures are slightly displaced.  Adrenals are normal. Gallstones appear to be present. Thyroid unremarkable. No significant axillary adenopathy. Again noted are multiple left rib fractures.  IMPRESSION: 1. Prominent tension pneumothorax on the left. Critical Value/emergent results were called by telephone at the time of interpretation on 08/29/2013 at 11:37 PM to Dr. Antonietta Breach , who verbally acknowledged these results. 2. Multiple displaced left rib fractures.   Electronically Signed   By: Marcello Moores  Register   On: 08/29/2013 23:37   Ct Cervical Spine Wo Contrast  08/29/2013   CLINICAL DATA:  Fall.  EXAM: CT CERVICAL, THORACIC, AND LUMBAR SPINE WITHOUT CONTRAST  TECHNIQUE: Multidetector CT imaging of the cervical, lumbar, and thoracic spine was performed with intravenous contrast. Multiplanar CT image reconstructions were also generated.  FINDINGS: CT CERVICAL SPINE FINDINGS  Severe degenerative changes noted of the cervical spine with multilevel disc degeneration and endplate osteophyte formation. There is no evidence of fracture or dislocation. As noted on chest CT report pneumothorax is present on the left.  CT THORACIC SPINE FINDINGS  Diffuse degenerative changes and osteopenia and thoracic spine. Minimal compressions are noted. These may be old. No evidence of retropulsed fragments. No displaced fractures are noted.  CT LUMBAR SPINE FINDINGS  Mild lumbar  compression fractures are noted L1, L3, and L4. Age undetermined. No retropulsed prominent fragments are noted. No bony spinal stenosis.  Again noted is a tension pneumothorax on the left with multiple left rib fractures. Atelectatic changes on the right with of bilateral pleural effusion present. Right nondisplaced rib fractures cannot be excluded.  : IMPRESSION:  1. Severe degenerative changes cervical spine. No evidence of acute fracture. 2. Pneumothorax on the left.  Reference is made to chest CT report. 3. Multiple minimal thoracic spine compression, these may be old. No evidence of retropulsed fragments. 4. Mild lumbar compression fractures, L1, L3, and L4. Age undetermined. No prominent retropulsed fragments noted.   Electronically Signed   By: [place]   On: 08/29/2013 23:41   Ct Thoracic Spine Wo Contrast  08/29/2013   CLINICAL DATA:  Fall.  EXAM: CT CERVICAL, THORACIC, AND LUMBAR SPINE WITHOUT CONTRAST  TECHNIQUE: Multidetector CT imaging of the cervical, lumbar, and thoracic spine was performed with intravenous contrast. Multiplanar CT image reconstructions were also generated.  FINDINGS: CT CERVICAL SPINE FINDINGS  Severe degenerative changes noted of the cervical spine with multilevel disc degeneration and endplate osteophyte formation. There is no evidence of fracture or dislocation. As noted on chest CT report pneumothorax is present on the left.  CT THORACIC SPINE FINDINGS  Diffuse degenerative changes and osteopenia and thoracic spine. Minimal compressions are noted. These may be old. No evidence of retropulsed fragments. No displaced fractures are noted.  CT LUMBAR SPINE FINDINGS  Mild lumbar compression fractures are noted L1, L3, and L4. Age undetermined. No retropulsed prominent fragments are noted. No bony spinal stenosis.  Again noted is a tension pneumothorax on the left with multiple left rib fractures. Atelectatic changes on the right with of bilateral pleural effusion present.  Right nondisplaced rib fractures cannot be excluded.  :  IMPRESSION:  1. Severe degenerative changes cervical spine. No evidence of acute fracture. 2. Pneumothorax on the left.  Reference is made to chest CT report. 3. Multiple minimal thoracic spine compression, these may be old. No evidence of retropulsed fragments. 4. Mild lumbar compression fractures, L1, L3, and L4. Age undetermined. No prominent retropulsed fragments noted.   Electronically Signed   By: [place]   On: 08/29/2013 23:41   Ct Lumbar Spine Wo Contrast  08/29/2013   CLINICAL DATA:  Fall.  EXAM: CT CERVICAL, THORACIC, AND LUMBAR SPINE WITHOUT CONTRAST  TECHNIQUE: Multidetector CT imaging of the cervical, lumbar, and thoracic spine was performed with intravenous contrast. Multiplanar CT image reconstructions were also generated.  FINDINGS: CT CERVICAL SPINE FINDINGS  Severe degenerative changes noted of the cervical spine with multilevel disc degeneration and endplate osteophyte formation. There is no evidence of fracture or dislocation. As noted on chest CT report pneumothorax is present on the left.  CT THORACIC SPINE FINDINGS  Diffuse degenerative changes and osteopenia and thoracic spine. Minimal compressions are noted. These may be old. No evidence of retropulsed fragments. No displaced fractures are noted.  CT LUMBAR SPINE FINDINGS  Mild lumbar compression fractures are noted L1, L3, and L4. Age undetermined. No retropulsed prominent fragments are noted. No bony spinal stenosis.  Again noted is a tension pneumothorax on the left with multiple left rib fractures. Atelectatic changes on the right with of bilateral pleural effusion present. Right nondisplaced rib fractures cannot be excluded.  : IMPRESSION:  1. Severe degenerative changes cervical spine. No evidence of acute fracture. 2. Pneumothorax on the left.  Reference is made to chest CT report. 3. Multiple minimal thoracic spine compression, these may be old. No evidence of  retropulsed fragments. 4. Mild lumbar compression fractures, L1, L3, and L4. Age undetermined. No prominent retropulsed fragments noted.   Electronically Signed   By: Marcello Moores  Register   On: 08/29/2013 23:41    Review of Systems  Constitutional: Negative for weight loss.  HENT: Negative for ear discharge, ear pain, hearing loss and tinnitus.   Eyes: Negative for blurred vision, double vision, photophobia and pain.  Respiratory: Positive for shortness of breath. Negative for cough and sputum production.   Cardiovascular: Positive for chest pain.  Gastrointestinal: Negative for nausea, vomiting and abdominal pain.  Genitourinary: Negative for dysuria, urgency, frequency and flank pain.  Musculoskeletal: Positive for back pain. Negative for falls, joint pain, myalgias and neck pain.  Neurological: Negative for dizziness, tingling, sensory change, focal weakness, loss of consciousness and headaches.  Endo/Heme/Allergies: Does not bruise/bleed easily.  Psychiatric/Behavioral: Negative for depression, memory loss and substance abuse. The patient is not nervous/anxious.     Blood pressure 119/79, pulse 73, temperature 99.5 F (37.5 C), temperature source Oral, resp. rate 18, SpO2 94.00%. Physical Exam  WDWN in NAD; resting comfortably Head: Normocephalic and atraumatic.  Eyes: Conjunctivae and EOM are normal. Pupils are equal, round, and reactive to light. No scleral icterus.  Neck: Normal range of motion. Neck supple. No JVD present. Cervical collar in place Cardiovascular: Normal rate, regular rhythm and normal heart sounds. Exam reveals no gallop and no friction rub.  No murmur heard.  Pulmonary/Chest: Effort normal and breath sounds normal. No respiratory distress. Tender left posterolateral chest Abdominal: Soft. He exhibits no distension and no mass. There is mild tenderness in the left flank. There is no rebound and no guarding.  Musculoskeletal: Normal range of motion. He exhibits no  edema and no tenderness.  Thoracic spine and left sided inferior ribs are tender to palpation, no bony abnormality or deformity, no step-offs, no acute abnormality of the spine  Neurological: He is alert and oriented to person, place, and time.  Skin: Skin is warm and dry.  Psychiatric: He has a normal mood and affect. His behavior is normal. Judgment and thought content normal.  Assessment/Plan Imp:  Fall 1.  Large left pneumothorax 2.  Multiple slightly displaced posterolateral left rib fractures 3.  Multiple thoracic/ lumbar spine compression fractures - unknown age   Chest tube placement Admit to Trauma service  Shantese Raven K. 08/30/2013, 12:15 AM   Procedures

## 2013-08-31 ENCOUNTER — Inpatient Hospital Stay (HOSPITAL_COMMUNITY): Payer: Self-pay

## 2013-08-31 DIAGNOSIS — W19XXXA Unspecified fall, initial encounter: Secondary | ICD-10-CM | POA: Diagnosis present

## 2013-08-31 DIAGNOSIS — R0981 Nasal congestion: Secondary | ICD-10-CM | POA: Insufficient documentation

## 2013-08-31 DIAGNOSIS — E785 Hyperlipidemia, unspecified: Secondary | ICD-10-CM

## 2013-08-31 DIAGNOSIS — F17200 Nicotine dependence, unspecified, uncomplicated: Secondary | ICD-10-CM

## 2013-08-31 DIAGNOSIS — S2242XA Multiple fractures of ribs, left side, initial encounter for closed fracture: Secondary | ICD-10-CM | POA: Diagnosis present

## 2013-08-31 DIAGNOSIS — E119 Type 2 diabetes mellitus without complications: Secondary | ICD-10-CM

## 2013-08-31 LAB — GLUCOSE, CAPILLARY
GLUCOSE-CAPILLARY: 123 mg/dL — AB (ref 70–99)
GLUCOSE-CAPILLARY: 182 mg/dL — AB (ref 70–99)
Glucose-Capillary: 123 mg/dL — ABNORMAL HIGH (ref 70–99)
Glucose-Capillary: 127 mg/dL — ABNORMAL HIGH (ref 70–99)

## 2013-08-31 LAB — LIPID PANEL
Cholesterol: 127 mg/dL (ref 0–200)
HDL: 45 mg/dL (ref >39)
LDL Cholesterol: 69 mg/dL (ref 0–99)
Total CHOL/HDL Ratio: 2.8 RATIO
Triglycerides: 66 mg/dL (ref <150)
VLDL: 13 mg/dL (ref 0–40)

## 2013-08-31 MED ORDER — ENOXAPARIN SODIUM 30 MG/0.3ML ~~LOC~~ SOLN
30.0000 mg | Freq: Two times a day (BID) | SUBCUTANEOUS | Status: DC
Start: 2013-08-31 — End: 2013-09-01
  Administered 2013-08-31 – 2013-09-01 (×2): 30 mg via SUBCUTANEOUS
  Filled 2013-08-31 (×3): qty 0.3

## 2013-08-31 MED ORDER — ASPIRIN 325 MG PO TABS: 325.0000 mg | ORAL_TABLET | Freq: Every day | ORAL | Status: DC | PRN

## 2013-08-31 MED ORDER — OXYCODONE HCL 5 MG PO TABS
10.0000 mg | ORAL_TABLET | ORAL | Status: DC | PRN
  Administered 2013-08-31: 10 mg via ORAL
  Administered 2013-08-31: 15 mg via ORAL
  Administered 2013-08-31: 10 mg via ORAL
  Filled 2013-08-31 (×2): qty 3
  Filled 2013-08-31: qty 2

## 2013-08-31 MED ORDER — HYDROMORPHONE HCL PF 1 MG/ML IJ SOLN
1.0000 mg | INTRAMUSCULAR | Status: DC | PRN
  Administered 2013-08-31 (×2): 1 mg via INTRAVENOUS
  Filled 2013-08-31 (×2): qty 1

## 2013-08-31 MED ORDER — LIVING WELL WITH DIABETES BOOK
Freq: Once | Status: AC
  Administered 2013-08-31: 13:00:00
  Filled 2013-08-31: qty 1

## 2013-08-31 MED ORDER — TRAMADOL HCL 50 MG PO TABS
100.0000 mg | ORAL_TABLET | Freq: Four times a day (QID) | ORAL | Status: DC
  Administered 2013-08-31 – 2013-09-01 (×4): 100 mg via ORAL
  Filled 2013-08-31 (×4): qty 2

## 2013-08-31 MED ORDER — PHENYLEPHRINE HCL 1 % NA SOLN
2.0000 [drp] | Freq: Four times a day (QID) | NASAL | Status: DC | PRN
  Filled 2013-08-31: qty 15

## 2013-08-31 MED ORDER — NAPROXEN 500 MG PO TABS
500.0000 mg | ORAL_TABLET | Freq: Two times a day (BID) | ORAL | Status: DC
  Administered 2013-08-31 – 2013-09-01 (×2): 500 mg via ORAL
  Filled 2013-08-31 (×5): qty 1

## 2013-08-31 NOTE — Progress Notes (Signed)
PT Cancellation Note  Patient Details Name: Derek [name] MRN: [id number] DOB: 03/17/1956   Cancelled Treatment:    Reason Eval/Treat Not Completed: Patient at procedure or test/unavailable (patient with diabetic coordinator at this time)   [name], [name] J 08/31/2013, 2:19 PM [name], PT DPT  [phone]

## 2013-08-31 NOTE — Clinical Social Work Note (Signed)
Clinical Social Work Department BRIEF PSYCHOSOCIAL ASSESSMENT 08/31/2013  Patient:  Derek [name], Derek [name]     Account Number:  [id number]     Admit date:  08/29/2013  Clinical Social Worker:  Myles Lipps  Date/Time:  08/31/2013 04:30 PM  Referred by:  Physician  Date Referred:  08/31/2013 Referred for  Psychosocial assessment   Other Referral:   Interview type:  Patient Other interview type:   No family/friends at bedside    PSYCHOSOCIAL DATA Living Status:  PARENTS Admitted from facility:   Level of care:   Primary support name:  Jsiah, Menta  [phone] Primary support relationship to patient:  CHILD, ADULT Degree of support available:   Strong    CURRENT CONCERNS Current Concerns  None Noted   Other Concerns:    SOCIAL WORK ASSESSMENT / PLAN Clinical Social Worker met with patient at bedside to offer support and discuss patient needs at discharge.  Patient states that he was at his house attempting to cover his wood when he missed a ledge and fell backwards hitting his back and side.  Patient states that he lives at home with his 76 year old mother and his 25 year old daughter. Patient states that both of them are physically capable to assist him in any way that he may need.  Patient states that he will have adequate 24 hour support at home at discharge and plans to return home once medically ready.    Clinical Social Worker inquired about current substance use.  Patient states that he does not drink or use any type of drugs not prescribed to him.  SBIRT complete and no further resources needed at this time.  CSW signing off. Please reconsult if further needs arise prior to discharge.   Assessment/plan status:  No Further Intervention Required Other assessment/ plan:   Information/referral to community resources:   Holiday representative offered patient resources as well as teaching service residents, however patient politely declined at this time.     PATIENT'S/FAMILY'S RESPONSE TO PLAN OF CARE: Patient alert and oriented x3 sitting up in bed.  Patient states that he feels bullied by therapies and is not willing to move at the pace they are requesting.  Patient has asked his daughter to come and pick him up because he is not satisfied with the continued care and the need for quick progression.  Patient feels he is being pushed too quickly and not respected.  CSW recommended notifying patient experience, however patient declined.  Patient feels he has adequate support at home and is ready to get back to his family.  Patient did verbalize his understanding of social work role and appreciation for support and concern.

## 2013-08-31 NOTE — Progress Notes (Signed)
Subjective: Pt states he is getting his pain medicine q 2 hrs, and even a 10 minute delay causes him significant discomfort.   Pt is extremely resistant to talk of any PT consult or attempts to get OOB with PT assist.  Pt very resistant to "push through pain" at all, and becomes angry if it is mentioned.   Doing well with diet.  Denies BM and states flatus is decreased but "normal" for him. Pt also complains of nasal congestion and asks if he can have phenylephrine nose spray.  Objective: Vital signs in last 24 hours: Temp:  [97.6 F (36.4 C)-99.1 F (37.3 C)] 98.2 F (36.8 C) (03/20 0612) Pulse Rate:  [63-89] 70 (03/20 0612) Resp:  [9-22] 17 (03/20 0612) BP: (103-134)/(63-107) 120/67 mmHg (03/20 0612) SpO2:  [91 %-98 %] 97 % (03/20 0612) Weight:  [241 lb 10 oz (109.6 kg)] 241 lb 10 oz (109.6 kg) (03/19 2011)    Intake/Output from previous day: 03/19 0701 - 03/20 0700 In: 3316.7 [P.O.:1200; I.V.:2116.7] Out: 1023 [Urine:1000; Chest Tube:23] Intake/Output this shift:    PE: Gen:  Alert, NAD when sitting still Card:  RRR, no M/G/R heard Pulm:  Clear in upper fields, diminished lower, no obvious W/R/R Chest: Chest tube in place but slipped out a moderate amount.  14 mark visible at dressing. + Air leak with cough. Sanguinous drainage on dressing with small amount in tubing. Exquisitely tender over L ribs. IS to just above 1250 Abd: Soft, ND, +BS, no HSM, Non tender to R quadrants, but pt resistant to palpation of L quadrants due to proximity of rib pain.   Lab Results:   Recent Labs  08/29/13 2216 08/30/13 0305  WBC 8.3 10.4  HGB 17.4* 15.9  HCT 49.6 46.4  PLT PLATELET CLUMPS NOTED ON SMEAR, COUNT APPEARS ADEQUATE 176   BMET  Recent Labs  08/29/13 2216 08/30/13 0305  NA 139 140  K 3.9 4.2  CL 100 101  CO2 24 28  GLUCOSE 178* 178*  BUN 10 11  CREATININE 0.61 0.77  CALCIUM 9.2 8.9   PT/INR No results found for this basename: LABPROT, INR,  in the last 72  hours CMP     Component Value Date/Time   NA 140 08/30/2013 0305   K 4.2 08/30/2013 0305   CL 101 08/30/2013 0305   CO2 28 08/30/2013 0305   GLUCOSE 178* 08/30/2013 0305   BUN 11 08/30/2013 0305   CREATININE 0.77 08/30/2013 0305   CALCIUM 8.9 08/30/2013 0305   GFRNONAA >90 08/30/2013 0305   GFRAA >90 08/30/2013 0305   Lipase  No results found for this basename: lipase       Studies/Results: Dg Ribs Unilateral W/chest Left  08/29/2013   CLINICAL DATA:  Fall.  EXAM: LEFT RIBS AND CHEST - 3+ VIEW  COMPARISON:  None.  FINDINGS: Multiple left posterior rib fractures are noted with a prominent left pneumothorax.  IMPRESSION: Multiple posterior left rib fractures with prominent left-sided pneumothorax. These results were called by telephone at the time of interpretation on 08/29/2013 at 9:45 PM to [facility] , who verbally acknowledged these results.   Electronically Signed   By: Maisie Fushomas  Register   On: 08/29/2013 21:46   Dg Cervical Spine 1 View  08/29/2013   CLINICAL DATA:  Fall.  EXAM: DG CERVICAL SPINE - 1 VIEW  COMPARISON:  None.  FINDINGS: Single cross-table lateral view of the cervical spine demonstrates only to the C4 level. Cannot visualize below this level due  to overlying shoulders. Alignment in the upper cervical spine is normal. Prevertebral soft tissues normal.  IMPRESSION: Severely limited clearing view, demonstrating to the superior aspect of C4. The remainder of the cervical spine is obscured due to overlying shoulders.   Electronically Signed   By: Charlett Nose M.D.   On: 08/29/2013 21:41   Dg Thoracic Spine 2 View  08/29/2013   CLINICAL DATA:  Fall.  EXAM: THORACIC SPINE - 2 VIEW  COMPARISON:  Diffuse degenerative changes thoracic spine. No acute abnormality.  FINDINGS: There is no evidence of thoracic spine fracture. Alignment is normal. No other significant bone abnormalities are identified.  IMPRESSION: Diffuse degenerative change thoracic spine, no acute abnormality.    Electronically Signed   By: Maisie Fus  Register   On: 08/29/2013 21:41   Dg Lumbar Spine 2-3 Views  08/29/2013   CLINICAL DATA:  Fall.  EXAM: LUMBAR SPINE - 2-3 VIEW  COMPARISON:  No prior.  FINDINGS: Diffuse degenerative changes of the lumbar spine. Mild compression L3 and L4 noted. Mild compression L1 noted. Age of compressions undetermined. MRI can be obtained for further evaluation.  IMPRESSION: Multiple mild lumbar compression fractures, age undetermined. Further evaluation with lumbar MRI can be obtained.   Electronically Signed   By: Maisie Fus  Register   On: 08/29/2013 21:43   Ct Chest Wo Contrast  08/29/2013   CLINICAL DATA:  Trauma.  EXAM: CT CHEST WITHOUT CONTRAST  TECHNIQUE: Multidetector CT imaging of the chest was performed following the standard protocol without IV contrast.  COMPARISON:  None.  FINDINGS: Thoracic aorta normal caliber. Heart size normal. Coronary artery disease.  No mediastinal mass.  Thoracic esophagus normal.  Large airways are patent. Tension pneumothorax is present on the left. The pneumothorax is prominent. Multiple left rib fractures are present. These fractures are slightly displaced.  Adrenals are normal. Gallstones appear to be present. Thyroid unremarkable. No significant axillary adenopathy. Again noted are multiple left rib fractures.  IMPRESSION: 1. Prominent tension pneumothorax on the left. Critical Value/emergent results were called by telephone at the time of interpretation on 08/29/2013 at 11:37 PM to Dr. Antony Madura , who verbally acknowledged these results. 2. Multiple displaced left rib fractures.   Electronically Signed   By: Maisie Fus  Register   On: 08/29/2013 23:37   Ct Cervical Spine Wo Contrast  08/29/2013   CLINICAL DATA:  Fall.  EXAM: CT CERVICAL, THORACIC, AND LUMBAR SPINE WITHOUT CONTRAST  TECHNIQUE: Multidetector CT imaging of the cervical, lumbar, and thoracic spine was performed with intravenous contrast. Multiplanar CT image reconstructions were also  generated.  FINDINGS: CT CERVICAL SPINE FINDINGS  Severe degenerative changes noted of the cervical spine with multilevel disc degeneration and endplate osteophyte formation. There is no evidence of fracture or dislocation. As noted on chest CT report pneumothorax is present on the left.  CT THORACIC SPINE FINDINGS  Diffuse degenerative changes and osteopenia and thoracic spine. Minimal compressions are noted. These may be old. No evidence of retropulsed fragments. No displaced fractures are noted.  CT LUMBAR SPINE FINDINGS  Mild lumbar compression fractures are noted L1, L3, and L4. Age undetermined. No retropulsed prominent fragments are noted. No bony spinal stenosis.  Again noted is a tension pneumothorax on the left with multiple left rib fractures. Atelectatic changes on the right with of bilateral pleural effusion present. Right nondisplaced rib fractures cannot be excluded.  : IMPRESSION:  1. Severe degenerative changes cervical spine. No evidence of acute fracture. 2. Pneumothorax on the left.  Reference is  made to chest CT report. 3. Multiple minimal thoracic spine compression, these may be old. No evidence of retropulsed fragments. 4. Mild lumbar compression fractures, L1, L3, and L4. Age undetermined. No prominent retropulsed fragments noted.   Electronically Signed   By: Maisie Fus  Register   On: 08/29/2013 23:41   Ct Thoracic Spine Wo Contrast  08/29/2013   CLINICAL DATA:  Fall.  EXAM: CT CERVICAL, THORACIC, AND LUMBAR SPINE WITHOUT CONTRAST  TECHNIQUE: Multidetector CT imaging of the cervical, lumbar, and thoracic spine was performed with intravenous contrast. Multiplanar CT image reconstructions were also generated.  FINDINGS: CT CERVICAL SPINE FINDINGS  Severe degenerative changes noted of the cervical spine with multilevel disc degeneration and endplate osteophyte formation. There is no evidence of fracture or dislocation. As noted on chest CT report pneumothorax is present on the left.  CT  THORACIC SPINE FINDINGS  Diffuse degenerative changes and osteopenia and thoracic spine. Minimal compressions are noted. These may be old. No evidence of retropulsed fragments. No displaced fractures are noted.  CT LUMBAR SPINE FINDINGS  Mild lumbar compression fractures are noted L1, L3, and L4. Age undetermined. No retropulsed prominent fragments are noted. No bony spinal stenosis.  Again noted is a tension pneumothorax on the left with multiple left rib fractures. Atelectatic changes on the right with of bilateral pleural effusion present. Right nondisplaced rib fractures cannot be excluded.  : IMPRESSION:  1. Severe degenerative changes cervical spine. No evidence of acute fracture. 2. Pneumothorax on the left.  Reference is made to chest CT report. 3. Multiple minimal thoracic spine compression, these may be old. No evidence of retropulsed fragments. 4. Mild lumbar compression fractures, L1, L3, and L4. Age undetermined. No prominent retropulsed fragments noted.   Electronically Signed   By: Maisie Fus  Register   On: 08/29/2013 23:41   Ct Lumbar Spine Wo Contrast  08/29/2013   CLINICAL DATA:  Fall.  EXAM: CT CERVICAL, THORACIC, AND LUMBAR SPINE WITHOUT CONTRAST  TECHNIQUE: Multidetector CT imaging of the cervical, lumbar, and thoracic spine was performed with intravenous contrast. Multiplanar CT image reconstructions were also generated.  FINDINGS: CT CERVICAL SPINE FINDINGS  Severe degenerative changes noted of the cervical spine with multilevel disc degeneration and endplate osteophyte formation. There is no evidence of fracture or dislocation. As noted on chest CT report pneumothorax is present on the left.  CT THORACIC SPINE FINDINGS  Diffuse degenerative changes and osteopenia and thoracic spine. Minimal compressions are noted. These may be old. No evidence of retropulsed fragments. No displaced fractures are noted.  CT LUMBAR SPINE FINDINGS  Mild lumbar compression fractures are noted L1, L3, and L4. Age  undetermined. No retropulsed prominent fragments are noted. No bony spinal stenosis.  Again noted is a tension pneumothorax on the left with multiple left rib fractures. Atelectatic changes on the right with of bilateral pleural effusion present. Right nondisplaced rib fractures cannot be excluded.  : IMPRESSION:  1. Severe degenerative changes cervical spine. No evidence of acute fracture. 2. Pneumothorax on the left.  Reference is made to chest CT report. 3. Multiple minimal thoracic spine compression, these may be old. No evidence of retropulsed fragments. 4. Mild lumbar compression fractures, L1, L3, and L4. Age undetermined. No prominent retropulsed fragments noted.   Electronically Signed   By: Maisie Fus  Register   On: 08/29/2013 23:41   Dg Chest Port 1 View  08/31/2013   CLINICAL DATA:  Trauma; recent pneumothorax  EXAM: PORTABLE CHEST - 1 VIEW  COMPARISON:  August 30, 2013  FINDINGS: The left chest tube has become partially withdrawn such that the side port of the chest tube is at the level of the lateral pleura. There is a tiny left apical pneumothorax without tension component.  There are rib fractures on the left, stable.  There is subsegmental atelectasis in the lung bases. There is some consolidation in the medial left base. Lungs are otherwise clear. Heart size and pulmonary vascularity are normal. No adenopathy.  IMPRESSION: Rather minimal apical pneumothorax on the left. Note that the chest tube side port is at the level of the lateral pleural surface. There is consolidation in the medial left base with subsegmental atelectasis in both lung bases.   Electronically Signed   By: Bretta Bang M.D.   On: 08/31/2013 07:06   Dg Chest Portable 1 View  08/30/2013   CLINICAL DATA:  Left chest tube.  EXAM: PORTABLE CHEST - 1 VIEW  COMPARISON:  None.  FINDINGS: Mediastinum unremarkable. Mild cardiomegaly, no pulmonary venous congestion. Bibasilar atelectasis present. No focal pulmonary infiltrate.  Previously identified with a tension pneumothorax on left is been resolved. Chest tubes in good anatomic position. Multiple left rib fractures are again noted.  IMPRESSION: 1. Interim resolution of tension pneumothorax on the left. Left chest tube in good anatomic position. 2. Multiple left rib fractures again noted. 3. Mild bibasilar atelectasis.   Electronically Signed   By: Maisie Fus  Register   On: 08/30/2013 00:27    Anti-infectives: Anti-infectives   None       Assessment/Plan Pneumothorax, closed, traumatic  S/p chest tube S/p removal of chest tube Diabetes Type 2  1.  Chest tube side port had moved to lateral pleural surface.  Removed at bedside by Charma Igo, PA-C and occlusive dressing applied. 2.  Repeat CXR this morning s/p chest tube removal 3.  SCDs for VTE proph, Add Lovenox @ 30mg  BID 4.  Awaiting PT consult 5.  Continue IS, work towards ambulating with PT assist. 6.  Add Oxy IR 10-20mg  q 4hr for pain. Re-assess pain management later today. 7.  Mean Plasma Glucose levels put A1C at 7.9 on 08/30/13, diagnostic for Diabetes.   8.  Pt education on Phenylephrine and tachyphylaxis, but wishes to proceed with treatment. 9.  Medicine and Diabetic nurse educator consults for Diabetes.    LOS: 2 days    Casimiro Needle A. [employer] 08/31/2013, 7:27 AM [facility] Phone #: [phone]

## 2013-08-31 NOTE — Consult Note (Signed)
Family Medicine Teaching Service  Consult Note Service Pager: [phone]  Patient name: Derek [name] PatientBarry Sherwood Medical record number: [id number] Date of birth: [birth year] Age: 58 y.o. Gender: male  Primary Care Provider: No primary provider on file.  Chief Complaint: diabetes management   Assessment and Plan: Derek [name] is a 58 y.o. male presenting with fall from height with multiple rib fractures and large left pneumothorax.  Had a left tube thoracostomy. Family medicine consulted for diabetes management.    #DM type 2, New onset: Hgb A1c 7.9. Sugars are not severely elevated. Patient reports having a good understanding of diabetes. Labs are normal.  - continue Current on Sensitive SSI  - Lipid panel: pending  - would recommend starting metformin 500 mg BID on discharge with follow up from a Primary care physician.  - diet as below.  - Diabetes educator recommended and ordered by primary team  FEN/GI: Carb modified/ saline lock  Prophylaxis: per surgery   History of Present Illness: Derek [name] is a 58 y.o. male presenting with fall from height with multiple rib fractures and large left pneumothorax.  Had a left tube thoracostomy.  Patient states that he has a good understanding of diabetes. 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 He reports to not seeing the doctor on a regular basis and never gets sick. He doesn't take any medication for diabetes. Patient cuts interview short asking us to return at another time.  Review Of Systems: Per HPI with the following additions: none  Otherwise 12 point review of systems was performed and was unremarkable.  Patient Active Problem List   Diagnosis Date Noted  . Fall 08/31/2013  . Multiple fractures of ribs of left side 08/31/2013  . Chronic nasal congestion 08/31/2013  . DM (diabetes mellitus) 08/31/2013  . Pneumothorax, closed, traumatic 08/30/2013   Past Medical  History: History reviewed. No pertinent past medical history. Past Surgical History: History reviewed. No pertinent past surgical history. Social History: History  Substance Use Topics  . Smoking status: Current Every Day Smoker -- 1.00 packs/day for 49 years    Types: Cigarettes  . Smokeless tobacco: Not on file  . Alcohol Use: No   Additional social history: none Please also refer to relevant sections of EMR.  Family History: History reviewed. No pertinent family history. Reports he has known many people with diabetes though does not go into it more than this.  Allergies and Medications: No Known Allergies No current facility-administered medications on file prior to encounter.   No current outpatient prescriptions on file prior to encounter.    Objective: BP 107/73  Pulse 71  Temp(Src) 98.2 F (36.8 C) (Oral)  Resp 16  Ht 6\' 2"  (1.88 m)  Wt 241 lb 10 oz (109.6 kg)  BMI 31.01 kg/m2  SpO2 95% Exam: General: NAD, alert, Caucasian male  HEENT: [place]/AT, EOMI, sclera clear PULM: Normal effort Neuro: no gross deficits, normal speech Psych: Appears distressed with his current situation having to be redirected in conversation several times.   Of note, patient halted interview and exam early stating he would like us to come back another time.  Labs and Imaging: CBC BMET   Recent Labs Lab 08/30/13 0305  WBC 10.4  HGB 15.9  HCT 46.4  PLT 176    Recent Labs Lab 08/30/13 0305  NA 140  K 4.2  CL 101  CO2 28  BUN 11  CREATININE 0.77  GLUCOSE  178*  CALCIUM 8.9      Recent Labs Lab 08/30/13 1202 08/30/13 1723 08/31/13 0814 08/31/13 1137  GLUCAP 155* 134* 123* 123*     Myra Rude, MD 08/31/2013, 2:07 PM PGY-1, [facility] Family Medicine FPTS Intern pager: [phone], text pages welcome  I have seen and examined this patient briefly with Dr Jordan Likes and agree with his assessment and plan with my additions above in blue.  Leona Singleton,  MD PGY-2, [facility] Family Medicine

## 2013-08-31 NOTE — Progress Notes (Signed)
Pt given information and demonstrated how to access DM videos.   Pt voiced understanding and accessed video with RN.

## 2013-08-31 NOTE — Progress Notes (Signed)
Chest tube is out.  It was partially pulled out before we completely removed it.  CXR looks fine.  CPM  This patient has been seen and I agree with the findings and treatment plan.  Marta LamasJames O. Gae BonWyatt, III, MD, FACS [phone] (pager) [phone] (direct pager) Trauma Surgeon

## 2013-08-31 NOTE — Progress Notes (Signed)
Inpatient Diabetes Program Recommendations  AACE/ADA: New Consensus Statement on Inpatient Glycemic Control (2013)  Target Ranges:  Prepandial:   less than 140 mg/dL      Peak postprandial:   less than 180 mg/dL (1-2 hours)      Critically ill patients:  140 - 180 mg/dL   Reason for Visit: Diabetes Coordinator consult  Diabetes history: None Outpatient Diabetes medications: None Current orders for Inpatient glycemic control: Novolog SENSITIVE correction scale TID   Note: Spoke with patient about his HgbA1C of 7.9% confirming a diagnosis of diabetes.  Patient states that he suspected that he had diabetes because of the way he feels and that he "keeps a regular Pepsi in his hand all times". He said that his father probably had Type 2 diabetes. Stressed the importance of keeping his blood sugars under control.  Ordered Living Well with Diabetes booklet, will have him watch DM videos while here.  States that he does not have a PCP which he will need at discharge. Staff RNs to teach him to check own CBGs and teach him survival skills before discharge. Suggested that he purchase a [employer] meter and strips for checking blood sugars at home. Dietician consult ordered.  Will continue to follow. Smith MinceKendra Rhoderick Farrel RN BSN CDE

## 2013-08-31 NOTE — Plan of Care (Signed)
Problem: Food- and Nutrition-Related Knowledge Deficit (NB-1.1) Goal: Nutrition education Formal process to instruct or train a patient/client in a skill or to impart knowledge to help patients/clients voluntarily manage or modify food choices and eating behavior to maintain or improve health. Outcome: Completed/Met Date Met:  08/31/13  RD consulted for nutrition education regarding diabetes. Pt report that he drinks approximately 2 liters of Pepsi daily.    Lab Results  Component Value Date    HGBA1C 7.9* 08/30/2013    RD provided "Carbohydrate Counting for People with Diabetes" handout from the Academy of Nutrition and Dietetics. Discussed different food groups and their effects on blood sugar, emphasizing carbohydrate-containing foods. Provided list of carbohydrates and recommended serving sizes of common foods.  Discussed importance of controlled and consistent carbohydrate intake throughout the day. Provided examples of ways to balance meals/snacks and encouraged intake of high-fiber, whole grain complex carbohydrates. Teach back method used.  Expect fair compliance.  Body mass index is 31.01 kg/(m^2). Pt meets criteria for Obese Class I based on current BMI.  Current diet order is Carbohydrate Modified Medium, patient is consuming approximately 100% of meals at this time. Labs and medications reviewed. No further nutrition interventions warranted at this time. RD contact information provided. If additional nutrition issues arise, please re-consult RD.  Inda Coke MS, RD, LDN Inpatient Registered Dietitian Pager: [phone] After-hours pager: [phone]

## 2013-08-31 NOTE — Evaluation (Signed)
Physical Therapy Evaluation Patient Details Name: Derek [name] MRN: [id number] DOB: 15-Dec-1955 Today's Date: 08/31/2013 Time: [id number] PT Time Calculation (min): 16 min  PT Assessment / Plan / Recommendation History of Present Illness  58 yo male presents about 30 hours s/p an accident in which he fell backwards off of a concrete planter about 4 feet in height, landing on the left side of his back on a 2x4 piece of wood.  He complains of back and posterior chest pain.  He spoke to his chiropractor today, who urged him to come to the ED for evaluation.  He denies any weakness or numbness in any extremities.  No incontinence.  He denies any significant shortness of breath.  Trauma was called to evaluate the patient after his CT scans were complete.  Clinical Impression  Patient refusing PT services, states that he just wants his pain medicine and pills for home and to be let go.  Explained to patient and daughter importance of therapy assessment for determining equipment needs, assistance levels and to ensure that all things are made available to him for discharge. Explained to patient that orders for therapy and assessment were requested by MD for which the patient then began to get angry and state that this hospital is just trying to "force him to the point of pain and trying to force him out to make money off of him"  Patient states that he just wants to lie in bed until Monday and then have his pain medicine for home."  I explained at length to patient the medical risks of laying in bed for several days specifically related to risk of pneumonia and breathing issues (as patient is s/p chest tube removal).  Made several attempts to encourage mobility in vain.  Daughter came into the room during session. Daughter states that she has helped patient bathe and put his underwear on. Patient adamantly refusing any mobility, home set up questions and continues to request pain medicines and a wheel chair so that  he can "just leave".  Encouraged daughter to continue to work with patient on IS and encouraged patient for OOB.  Also stated that most likely they will continue to try to work on getting patient OOB to chair for periods of time to help prevent breathing issues. Daughter appeared receptive however, patient did not. At this point, do not feel that patient will be receptive to further therapy intervention.  Will sign off on patient at this time.        Follow Up Recommendations  Supervision/Assistance - 24 hour          Equipment Recommendations  None recommended by PT;Other (comment)          Precautions / Restrictions Precautions Precautions:  (chest tube removed) Restrictions Weight Bearing Restrictions: No   Pertinent Vitals/Pain No value given, patient reports pain         Visit Information  Last PT Received On: 08/31/13 Reason Eval/Treat Not Completed: Patient at procedure or test/unavailable (patient with diabetic coordinator at this time) History of Present Illness: 58 yo male presents about 30 hours s/p an accident in which he fell backwards off of a concrete planter about 4 feet in height, landing on the left side of his back on a 2x4 piece of wood.  He complains of back and posterior chest pain.  He spoke to his chiropractor today, who urged him to come to the ED for evaluation.  He denies any weakness or numbness in any extremities.  No incontinence.  He denies any significant shortness of breath.  Trauma was called to evaluate the patient after his CT scans were complete.       Prior Functioning  Home Living Family/patient expects to be discharged to:: Private residence      Fabio Asa 08/31/2013, 3:08 PM Charlotte Crumb, PT DPT  [phone]

## 2013-09-01 ENCOUNTER — Inpatient Hospital Stay (HOSPITAL_COMMUNITY): Payer: MEDICAID

## 2013-09-01 LAB — GLUCOSE, CAPILLARY
Glucose-Capillary: 144 mg/dL — ABNORMAL HIGH (ref 70–99)
Glucose-Capillary: 141 mg/dL — ABNORMAL HIGH (ref 70–99)

## 2013-09-01 MED ORDER — METHOCARBAMOL 750 MG PO TABS
750.0000 mg | ORAL_TABLET | Freq: Three times a day (TID) | ORAL | Status: DC
  Administered 2013-09-01 (×2): 750 mg via ORAL
  Filled 2013-09-01 (×5): qty 1

## 2013-09-01 MED ORDER — TRAMADOL HCL 50 MG PO TABS: 100.0000 mg | ORAL_TABLET | Freq: Four times a day (QID) | ORAL | Status: AC

## 2013-09-01 MED ORDER — METFORMIN HCL 500 MG PO TABS: 500.0000 mg | ORAL_TABLET | Freq: Two times a day (BID) | ORAL | Status: AC

## 2013-09-01 MED ORDER — OXYCODONE HCL 10 MG PO TABS: 10.0000 mg | ORAL_TABLET | ORAL | Status: AC | PRN

## 2013-09-01 NOTE — Consult Note (Signed)
FMTS Attending Note  I personally saw and evaluated the patient. The plan of care was discussed with the resident team. I agree with the assessment and plan as documented by the resident.   Patient informed of initiation of Metformin and Lipitor. Will need to establish care with PCP as outpatient.   Donnella ShamKyle Dreamer Carillo MD

## 2013-09-01 NOTE — Progress Notes (Signed)
Discharge instructions gone over with patient. Home medications gone over. Prescriptions given. Follow up appointment to be made with family doctor. Diabetes information printed and given to patient. My chart explained. Signs and symptoms of infection gone over and when to call the doctor. Wound care gone over. Patient verbalized understanding of instructions.

## 2013-09-01 NOTE — Discharge Instructions (Signed)
Apply a dry dressing to the left chest tube site.  May shower  Be sure to start taking the metformin for your diabetes.  Be sure to establish care with a primary care doctor to make your diabetes

## 2013-09-01 NOTE — Discharge Summary (Signed)
Physician Discharge Summary  Derek PatientBarry Derek [name] ZOX:[id number] DOB: 1955-12-30 DOA: 08/29/2013  PCP: No primary provider on file.  Consultation: family medicine  Admit date: 08/29/2013 Discharge date: 09/01/2013  Recommendations for Outpatient Follow-up:   Follow-up Information   Follow up with [facility]. (As needed)    Contact information:   [address] [place] [address] [phone]      Follow up with establish care with a primary care physician . (within a month)      Discharge Diagnoses:  1. 884ft fall 2. Pneumothorax 3. Left sided rib fracture 4. Newly diagnosed type II diabetes mellitus 5. Tobacco abuse   Surgical Procedure:  Chest tube placement  Discharge Condition: stable Disposition: home  Diet recommendation: carb modified   Filed Weights   08/30/13 0200 08/30/13 2011  Weight: 232 lb 5.8 oz (105.4 kg) 241 lb 10 oz (109.6 kg)    Filed Vitals:   09/01/13 0511  BP: 115/72  Pulse: 76  Temp: 98.7 F (37.1 C)  Resp: 20    Hospital Course:  [name] fall of about 4 feet in height onto concrete.  He was found to have a pneumothorax with multiple left sided rib fractures and had a left chest tube placed.  He refused an epidural catheter for pain control.  He was treated with IV and PO pain meds.  He was mobilized.  His vital signs remained stable.  Chest tube was removed and follow up CXR remained stable.  Adequate pull on IS.  He was found to have diabetes and family medicine was consulted to help manage CBGs.  On HD#3 the Derek [name] was tolerating a diet, pain well controlled, ambulating and therefore felt stable for discharge.  He was started on metformin.  We discussed medication risks, benefits and therapeutic alternatives.  We discussed primary care follow up and he plans to do so close to where he lives.   We discussed wound care, applying a dry dressing to left chest tube site and to call with  questions or concerns. We discussed his pain regimen and agreed on oxycodone and tramadol.  Side effects were discussed.  He was encouraged to call with questions or concerns and asked to follow up as needed.    Physical exam: General appearance: alert and oriented. Calm and cooperative No acute distress. VSS. Afebrile.  Resp: clear to auscultation bilaterally. Left ct site without erythema, dry dressing applied.   Cardio: S1S1 RRR without murmurs or gallops. No edema. GI: soft round and nontender. +BS x4 quadrants. No organomegaly, hernias or masses.  Pulses: +2 bilateral distal pulses without cyanosis   Discharge Instructions     Medication List         ANTACID PO  Take 1 tablet by mouth every 4 (four) hours as needed (acid reflux).     aspirin 325 MG tablet  Take 325 mg by mouth daily as needed for mild pain.     metFORMIN 500 MG tablet  Commonly known as:  GLUCOPHAGE  Take 1 tablet (500 mg total) by mouth 2 (two) times daily with a meal.     Oxycodone HCl 10 MG Tabs  Take 1-2 tablets (10-20 mg total) by mouth every 4 (four) hours as needed (10mg  for mild pain, 15mg  for moderate pain, 20mg  for severe pain).     phenylephrine 1 % nasal spray  Commonly known as:  NEO-SYNEPHRINE  Place 2 drops into both nostrils every 6 (  six) hours as needed for congestion.     traMADol 50 MG tablet  Commonly known as:  ULTRAM  Take 2 tablets (100 mg total) by mouth every 6 (six) hours.           Follow-up Information   Follow up with [facility]. (As needed)    Contact information:   [address] [place] [address] [phone]      Follow up with establish care with a primary care physician . (within a month)        The results of significant diagnostics from this hospitalization (including imaging, microbiology, ancillary and laboratory) are listed below for reference.    Significant Diagnostic Studies: Dg Ribs Unilateral W/chest  Left  08/29/2013   CLINICAL DATA:  Fall.  EXAM: LEFT RIBS AND CHEST - 3+ VIEW  COMPARISON:  None.  FINDINGS: Multiple left posterior rib fractures are noted with a prominent left pneumothorax.  IMPRESSION: Multiple posterior left rib fractures with prominent left-sided pneumothorax. These results were called by telephone at the time of interpretation on 08/29/2013 at 9:45 PM to [facility] , who verbally acknowledged these results.   Electronically Signed   By: Maisie Fus  Register   On: 08/29/2013 21:46   Dg Cervical Spine 1 View  08/29/2013   CLINICAL DATA:  Fall.  EXAM: DG CERVICAL SPINE - 1 VIEW  COMPARISON:  None.  FINDINGS: Single cross-table lateral view of the cervical spine demonstrates only to the C4 level. Cannot visualize below this level due to overlying shoulders. Alignment in the upper cervical spine is normal. Prevertebral soft tissues normal.  IMPRESSION: Severely limited clearing view, demonstrating to the superior aspect of C4. The remainder of the cervical spine is obscured due to overlying shoulders.   Electronically Signed   By: Charlett Nose M.D.   On: 08/29/2013 21:41   Dg Thoracic Spine 2 View  08/29/2013   CLINICAL DATA:  Fall.  EXAM: THORACIC SPINE - 2 VIEW  COMPARISON:  Diffuse degenerative changes thoracic spine. No acute abnormality.  FINDINGS: There is no evidence of thoracic spine fracture. Alignment is normal. No other significant bone abnormalities are identified.  IMPRESSION: Diffuse degenerative change thoracic spine, no acute abnormality.   Electronically Signed   By: Maisie Fus  Register   On: 08/29/2013 21:41   Dg Lumbar Spine 2-3 Views  08/29/2013   CLINICAL DATA:  Fall.  EXAM: LUMBAR SPINE - 2-3 VIEW  COMPARISON:  No prior.  FINDINGS: Diffuse degenerative changes of the lumbar spine. Mild compression L3 and L4 noted. Mild compression L1 noted. Age of compressions undetermined. MRI can be obtained for further evaluation.  IMPRESSION: Multiple mild lumbar compression  fractures, age undetermined. Further evaluation with lumbar MRI can be obtained.   Electronically Signed   By: Maisie Fus  Register   On: 08/29/2013 21:43   Ct Chest Wo Contrast  08/29/2013   CLINICAL DATA:  Trauma.  EXAM: CT CHEST WITHOUT CONTRAST  TECHNIQUE: Multidetector CT imaging of the chest was performed following the standard protocol without IV contrast.  COMPARISON:  None.  FINDINGS: Thoracic aorta normal caliber. Heart size normal. Coronary artery disease.  No mediastinal mass.  Thoracic esophagus normal.  Large airways are patent. Tension pneumothorax is present on the left. The pneumothorax is prominent. Multiple left rib fractures are present. These fractures are slightly displaced.  Adrenals are normal. Gallstones appear to be present. Thyroid unremarkable. No significant axillary adenopathy. Again noted are multiple left rib fractures.  IMPRESSION: 1. Prominent tension pneumothorax on the left. Critical Value/emergent results were called by telephone at the time of interpretation on 08/29/2013 at 11:37 PM to Dr. Antony Madura , who verbally acknowledged these results. 2. Multiple displaced left rib fractures.   Electronically Signed   By: Maisie Fus  Register   On: 08/29/2013 23:37   Ct Cervical Spine Wo Contrast  08/29/2013   CLINICAL DATA:  Fall.  EXAM: CT CERVICAL, THORACIC, AND LUMBAR SPINE WITHOUT CONTRAST  TECHNIQUE: Multidetector CT imaging of the cervical, lumbar, and thoracic spine was performed with intravenous contrast. Multiplanar CT image reconstructions were also generated.  FINDINGS: CT CERVICAL SPINE FINDINGS  Severe degenerative changes noted of the cervical spine with multilevel disc degeneration and endplate osteophyte formation. There is no evidence of fracture or dislocation. As noted on chest CT report pneumothorax is present on the left.  CT THORACIC SPINE FINDINGS  Diffuse degenerative changes and osteopenia and thoracic spine. Minimal compressions are noted. These may be old. No  evidence of retropulsed fragments. No displaced fractures are noted.  CT LUMBAR SPINE FINDINGS  Mild lumbar compression fractures are noted L1, L3, and L4. Age undetermined. No retropulsed prominent fragments are noted. No bony spinal stenosis.  Again noted is a tension pneumothorax on the left with multiple left rib fractures. Atelectatic changes on the right with of bilateral pleural effusion present. Right nondisplaced rib fractures cannot be excluded.  : IMPRESSION:  1. Severe degenerative changes cervical spine. No evidence of acute fracture. 2. Pneumothorax on the left.  Reference is made to chest CT report. 3. Multiple minimal thoracic spine compression, these may be old. No evidence of retropulsed fragments. 4. Mild lumbar compression fractures, L1, L3, and L4. Age undetermined. No prominent retropulsed fragments noted.   Electronically Signed   By: Maisie Fus  Register   On: 08/29/2013 23:41   Ct Thoracic Spine Wo Contrast  08/29/2013   CLINICAL DATA:  Fall.  EXAM: CT CERVICAL, THORACIC, AND LUMBAR SPINE WITHOUT CONTRAST  TECHNIQUE: Multidetector CT imaging of the cervical, lumbar, and thoracic spine was performed with intravenous contrast. Multiplanar CT image reconstructions were also generated.  FINDINGS: CT CERVICAL SPINE FINDINGS  Severe degenerative changes noted of the cervical spine with multilevel disc degeneration and endplate osteophyte formation. There is no evidence of fracture or dislocation. As noted on chest CT report pneumothorax is present on the left.  CT THORACIC SPINE FINDINGS  Diffuse degenerative changes and osteopenia and thoracic spine. Minimal compressions are noted. These may be old. No evidence of retropulsed fragments. No displaced fractures are noted.  CT LUMBAR SPINE FINDINGS  Mild lumbar compression fractures are noted L1, L3, and L4. Age undetermined. No retropulsed prominent fragments are noted. No bony spinal stenosis.  Again noted is a tension pneumothorax on the left with  multiple left rib fractures. Atelectatic changes on the right with of bilateral pleural effusion present. Right nondisplaced rib fractures cannot be excluded.  : IMPRESSION:  1. Severe degenerative changes cervical spine. No evidence of acute fracture. 2. Pneumothorax on the left.  Reference is made to chest CT report. 3. Multiple minimal thoracic spine compression, these may be old. No evidence of retropulsed fragments. 4. Mild lumbar compression fractures, L1, L3, and L4. Age undetermined. No prominent retropulsed fragments noted.   Electronically Signed   By: Maisie Fus  Register   On: 08/29/2013 23:41   Ct Lumbar Spine Wo Contrast  08/29/2013   CLINICAL DATA:  Fall.  EXAM: CT CERVICAL, THORACIC, AND LUMBAR SPINE WITHOUT  CONTRAST  TECHNIQUE: Multidetector CT imaging of the cervical, lumbar, and thoracic spine was performed with intravenous contrast. Multiplanar CT image reconstructions were also generated.  FINDINGS: CT CERVICAL SPINE FINDINGS  Severe degenerative changes noted of the cervical spine with multilevel disc degeneration and endplate osteophyte formation. There is no evidence of fracture or dislocation. As noted on chest CT report pneumothorax is present on the left.  CT THORACIC SPINE FINDINGS  Diffuse degenerative changes and osteopenia and thoracic spine. Minimal compressions are noted. These may be old. No evidence of retropulsed fragments. No displaced fractures are noted.  CT LUMBAR SPINE FINDINGS  Mild lumbar compression fractures are noted L1, L3, and L4. Age undetermined. No retropulsed prominent fragments are noted. No bony spinal stenosis.  Again noted is a tension pneumothorax on the left with multiple left rib fractures. Atelectatic changes on the right with of bilateral pleural effusion present. Right nondisplaced rib fractures cannot be excluded.  : IMPRESSION:  1. Severe degenerative changes cervical spine. No evidence of acute fracture. 2. Pneumothorax on the left.  Reference is made  to chest CT report. 3. Multiple minimal thoracic spine compression, these may be old. No evidence of retropulsed fragments. 4. Mild lumbar compression fractures, L1, L3, and L4. Age undetermined. No prominent retropulsed fragments noted.   Electronically Signed   By: Maisie Fus  Register   On: 08/29/2013 23:41   Dg Chest Port 1 View  09/01/2013   CLINICAL DATA:  f/u PTX  EXAM: PORTABLE CHEST - 1 VIEW  COMPARISON:  DG CHEST 1V PORT dated 08/31/2013  FINDINGS: Low lung volumes. Cardiac silhouette enlarged. Decreased bilateral interstitial prominence. Stable left apical pneumothorax. Multiple left rib fractures stable.  IMPRESSION: Improved pulmonary edema.  Otherwise stable chest radiograph.   Electronically Signed   By: Salome Holmes M.D.   On: 09/01/2013 10:29   Dg Chest Port 1 View  08/31/2013   CLINICAL DATA:  Pneumothorax  EXAM: PORTABLE CHEST - 1 VIEW  COMPARISON:  08/31/2013  FINDINGS: Cardiomediastinal silhouette is stable. Mild interstitial prominence bilaterally suspicious for mild interstitial edema. Again noted streaky right basilar atelectasis or infiltrate. The left chest tube has been removed. Persistent tiny residual left apical pneumothorax.  IMPRESSION: Mild interstitial prominence bilaterally suspicious for mild interstitial edema. Again noted streaky right basilar atelectasis or infiltrate. The left chest tube has been removed. Persistent tiny residual left apical pneumothorax.   Electronically Signed   By: Natasha Mead M.D.   On: 08/31/2013 10:27   Dg Chest Port 1 View  08/31/2013   CLINICAL DATA:  Trauma; recent pneumothorax  EXAM: PORTABLE CHEST - 1 VIEW  COMPARISON:  August 30, 2013  FINDINGS: The left chest tube has become partially withdrawn such that the side port of the chest tube is at the level of the lateral pleura. There is a tiny left apical pneumothorax without tension component.  There are rib fractures on the left, stable.  There is subsegmental atelectasis in the lung bases.  There is some consolidation in the medial left base. Lungs are otherwise clear. Heart size and pulmonary vascularity are normal. No adenopathy.  IMPRESSION: Rather minimal apical pneumothorax on the left. Note that the chest tube side port is at the level of the lateral pleural surface. There is consolidation in the medial left base with subsegmental atelectasis in both lung bases.   Electronically Signed   By: Bretta Bang M.D.   On: 08/31/2013 07:06   Dg Chest Portable 1 View  08/30/2013  CLINICAL DATA:  Left chest tube.  EXAM: PORTABLE CHEST - 1 VIEW  COMPARISON:  None.  FINDINGS: Mediastinum unremarkable. Mild cardiomegaly, no pulmonary venous congestion. Bibasilar atelectasis present. No focal pulmonary infiltrate. Previously identified with a tension pneumothorax on left is been resolved. Chest tubes in good anatomic position. Multiple left rib fractures are again noted.  IMPRESSION: 1. Interim resolution of tension pneumothorax on the left. Left chest tube in good anatomic position. 2. Multiple left rib fractures again noted. 3. Mild bibasilar atelectasis.   Electronically Signed   By: Maisie Fus  Register   On: 08/30/2013 00:27    Microbiology: Recent Results (from the past 240 hour(s))  MRSA PCR SCREENING     Status: None   Collection Time    08/30/13  1:29 AM      Result Value Ref Range Status   MRSA by PCR NEGATIVE  NEGATIVE Final   Comment:            The GeneXpert MRSA Assay (FDA     approved for NASAL specimens     only), is one component of a     comprehensive MRSA colonization     surveillance program. It is not     intended to diagnose MRSA     infection nor to guide or     monitor treatment for     MRSA infections.     Labs: Basic Metabolic Panel:  Recent Labs Lab 08/29/13 2216 08/30/13 0305  NA 139 140  K 3.9 4.2  CL 100 101  CO2 24 28  GLUCOSE 178* 178*  BUN 10 11  CREATININE 0.61 0.77  CALCIUM 9.2 8.9   Liver Function Tests: No results found for this  basename: AST, ALT, ALKPHOS, BILITOT, PROT, ALBUMIN,  in the last 168 hours No results found for this basename: LIPASE, AMYLASE,  in the last 168 hours No results found for this basename: AMMONIA,  in the last 168 hours CBC:  Recent Labs Lab 08/29/13 2216 08/30/13 0305  WBC 8.3 10.4  NEUTROABS 6.7  --   HGB 17.4* 15.9  HCT 49.6 46.4  MCV 89.9 91.7  PLT PLATELET CLUMPS NOTED ON SMEAR, COUNT APPEARS ADEQUATE 176   Cardiac Enzymes: No results found for this basename: CKTOTAL, CKMB, CKMBINDEX, TROPONINI,  in the last 168 hours BNP: BNP (last 3 results) No results found for this basename: PROBNP,  in the last 8760 hours CBG:  Recent Labs Lab 08/31/13 0814 08/31/13 1137 08/31/13 1641 08/31/13 2127 09/01/13 0741  GLUCAP 123* 123* 182* 127* 144*    Active Problems:   Pneumothorax, closed, traumatic   Fall   Multiple fractures of ribs of left side   DM (diabetes mellitus)   Time coordinating discharge: <30 mins  Signed:  Jamicah Anstead, ANP-BC

## 2013-09-01 NOTE — Discharge Summary (Signed)
Trauma surgery:  I personally interviewed and examined this patient this morning. I agree with the assessment and treatment plan outlined by Ms. Raybon, NP.  His lungs are clear. No crepitus. Trachea midline.  Agree with discharge summary and discharge plans.   Angelia MouldHaywood M. Derrell LollingIngram, M.D., [facility] Trauma Pager:   [phone]

## 2013-09-01 NOTE — Consult Note (Signed)
Family Medicine Teaching Service  DAILY CONSULT PROGRESS NOTE Service Pager: [phone]  Patient name: Derek [name] Medical record number: [id number] Date of birth: 1956-06-06 Age: 58 y.o. Gender: male  Primary Care Provider: No primary provider on file. Primary Service: Trauma Surgery  Chief Complaint: diabetes management   Assessment and Plan: Derek Derek [name] is a 58 y.o. male presenting with fall from height with multiple rib fractures and large left pneumothorax.  Had a left tube thoracostomy. Family medicine consulted for diabetes management.    #DM type 2, New onset: Hgb A1c 7.9. Sugars are not severely elevated. Patient reports having a good understanding of diabetes. Labs are normal.  - continue Current on Sensitive SSI  - ASCVD Risk of: 13.6%  Recommend High Potency Statin Therapy & smoking cessation - Recommend starting metformin 500 mg BID on discharge with follow up from a Primary care physician.  - Case Management for PCP resources (pt lives near [place]) - CHO mod diet - s/p DM education - FM to sign off, please call if any additional assistance needed - Discharge Order Recommendations:  1. Metformin 500mg  po bid  2. Lipitor 20mg  po qhs (will need titration to 40mg  for High potency effect but start lower to avoid side effects)  3. No convincing evidence for ASA in primary prevention. Defer ASA therapy unless indicated from Trauma standpoint  # Tobacco Abuse: Recommended cessation. Pt not agreeable  # Pneumothorax, pain: Per Trauma Team  FEN/GI: Carb modified/ saline lock  Prophylaxis: per surgery   SUBJECTIVE: Overall improved but upset with his dressing from thoracostomy tube  Objective: BP 115/72  Pulse 76  Temp(Src) 98.7 F (37.1 C) (Oral)  Resp 20  Ht 6\' 2"  (1.88 m)  Wt 241 lb 10 oz (109.6 kg)  BMI 31.01 kg/m2  SpO2 91% Exam: General: NAD, alert, Caucasian male  HEENT: [facility]/AT,  Neuro: no gross deficits Psych: Appears distressed with his current  situation having to be redirected in conversation several times.    Labs and Imaging: CBC BMET   Recent Labs Lab 08/30/13 0305  WBC 10.4  HGB 15.9  HCT 46.4  PLT 176    Recent Labs Lab 08/30/13 0305  NA 140  K 4.2  CL 101  CO2 28  BUN 11  CREATININE 0.77  GLUCOSE 178*  CALCIUM 8.9      Recent Labs Lab 08/31/13 0814 08/31/13 1137 08/31/13 1641 08/31/13 2127 09/01/13 0741  GLUCAP 123* 123* 182* 127* 144*    Recent Labs  08/30/13 0304 08/31/13 1938  HGBA1C 7.9*  --   TRIG  --  66  CHOL  --  127  HDL  --  45  LDLCALC  --  69    Derek MewsMichael D Tamar Miano, DO 09/01/2013, 9:08 AM PGY-3, [facility] FPTS Intern pager: [phone], text pages welcome

## 2013-09-02 NOTE — Consult Note (Signed)
FMTS ATTENDING CONSULT NOTE Jamieson Hetland,MD I  have seen and examined this patient, reviewed their chart. I have discussed this patient with the resident. I agree with the resident's findings, assessment and care plan.

## 2019-09-05 ENCOUNTER — Encounter (HOSPITAL_COMMUNITY): Payer: Self-pay | Admitting: Family Medicine

## 2019-09-05 ENCOUNTER — Inpatient Hospital Stay (HOSPITAL_COMMUNITY)
Admission: EM | Admit: 2019-09-05 | Discharge: 2019-09-13 | DRG: 871 | Disposition: E | Payer: Self-pay | Attending: Pulmonary Disease | Admitting: Pulmonary Disease

## 2019-09-05 ENCOUNTER — Emergency Department (HOSPITAL_COMMUNITY): Payer: Self-pay

## 2019-09-05 ENCOUNTER — Other Ambulatory Visit: Payer: Self-pay

## 2019-09-05 DIAGNOSIS — Z20822 Contact with and (suspected) exposure to covid-19: Secondary | ICD-10-CM | POA: Diagnosis present

## 2019-09-05 DIAGNOSIS — E1165 Type 2 diabetes mellitus with hyperglycemia: Secondary | ICD-10-CM | POA: Diagnosis present

## 2019-09-05 DIAGNOSIS — E871 Hypo-osmolality and hyponatremia: Secondary | ICD-10-CM

## 2019-09-05 DIAGNOSIS — K7200 Acute and subacute hepatic failure without coma: Secondary | ICD-10-CM | POA: Diagnosis present

## 2019-09-05 DIAGNOSIS — I429 Cardiomyopathy, unspecified: Secondary | ICD-10-CM | POA: Diagnosis present

## 2019-09-05 DIAGNOSIS — N179 Acute kidney failure, unspecified: Secondary | ICD-10-CM | POA: Diagnosis present

## 2019-09-05 DIAGNOSIS — I4819 Other persistent atrial fibrillation: Secondary | ICD-10-CM | POA: Diagnosis present

## 2019-09-05 DIAGNOSIS — E119 Type 2 diabetes mellitus without complications: Secondary | ICD-10-CM

## 2019-09-05 DIAGNOSIS — F17210 Nicotine dependence, cigarettes, uncomplicated: Secondary | ICD-10-CM | POA: Diagnosis present

## 2019-09-05 DIAGNOSIS — R6521 Severe sepsis with septic shock: Secondary | ICD-10-CM | POA: Diagnosis not present

## 2019-09-05 DIAGNOSIS — R21 Rash and other nonspecific skin eruption: Secondary | ICD-10-CM | POA: Diagnosis present

## 2019-09-05 DIAGNOSIS — R81 Glycosuria: Secondary | ICD-10-CM | POA: Diagnosis present

## 2019-09-05 DIAGNOSIS — I4891 Unspecified atrial fibrillation: Secondary | ICD-10-CM | POA: Diagnosis present

## 2019-09-05 DIAGNOSIS — T383X6A Underdosing of insulin and oral hypoglycemic [antidiabetic] drugs, initial encounter: Secondary | ICD-10-CM | POA: Diagnosis present

## 2019-09-05 DIAGNOSIS — J810 Acute pulmonary edema: Secondary | ICD-10-CM | POA: Diagnosis present

## 2019-09-05 DIAGNOSIS — J9690 Respiratory failure, unspecified, unspecified whether with hypoxia or hypercapnia: Secondary | ICD-10-CM

## 2019-09-05 DIAGNOSIS — E876 Hypokalemia: Secondary | ICD-10-CM

## 2019-09-05 DIAGNOSIS — E860 Dehydration: Secondary | ICD-10-CM | POA: Diagnosis present

## 2019-09-05 DIAGNOSIS — Z5329 Procedure and treatment not carried out because of patient's decision for other reasons: Secondary | ICD-10-CM | POA: Diagnosis present

## 2019-09-05 DIAGNOSIS — R824 Acetonuria: Secondary | ICD-10-CM | POA: Diagnosis present

## 2019-09-05 DIAGNOSIS — Z978 Presence of other specified devices: Secondary | ICD-10-CM

## 2019-09-05 DIAGNOSIS — D696 Thrombocytopenia, unspecified: Secondary | ICD-10-CM | POA: Diagnosis present

## 2019-09-05 DIAGNOSIS — G9341 Metabolic encephalopathy: Secondary | ICD-10-CM | POA: Diagnosis not present

## 2019-09-05 DIAGNOSIS — M542 Cervicalgia: Secondary | ICD-10-CM | POA: Diagnosis present

## 2019-09-05 DIAGNOSIS — J9601 Acute respiratory failure with hypoxia: Secondary | ICD-10-CM | POA: Diagnosis present

## 2019-09-05 DIAGNOSIS — I468 Cardiac arrest due to other underlying condition: Secondary | ICD-10-CM | POA: Diagnosis not present

## 2019-09-05 DIAGNOSIS — Z91128 Patient's intentional underdosing of medication regimen for other reason: Secondary | ICD-10-CM

## 2019-09-05 DIAGNOSIS — Z8249 Family history of ischemic heart disease and other diseases of the circulatory system: Secondary | ICD-10-CM

## 2019-09-05 DIAGNOSIS — J80 Acute respiratory distress syndrome: Secondary | ICD-10-CM

## 2019-09-05 DIAGNOSIS — R809 Proteinuria, unspecified: Secondary | ICD-10-CM | POA: Diagnosis present

## 2019-09-05 DIAGNOSIS — E8809 Other disorders of plasma-protein metabolism, not elsewhere classified: Secondary | ICD-10-CM | POA: Diagnosis present

## 2019-09-05 DIAGNOSIS — A419 Sepsis, unspecified organism: Principal | ICD-10-CM | POA: Diagnosis present

## 2019-09-05 DIAGNOSIS — I248 Other forms of acute ischemic heart disease: Secondary | ICD-10-CM | POA: Diagnosis present

## 2019-09-05 DIAGNOSIS — Z7982 Long term (current) use of aspirin: Secondary | ICD-10-CM

## 2019-09-05 DIAGNOSIS — I469 Cardiac arrest, cause unspecified: Secondary | ICD-10-CM

## 2019-09-05 DIAGNOSIS — J159 Unspecified bacterial pneumonia: Secondary | ICD-10-CM | POA: Diagnosis present

## 2019-09-05 DIAGNOSIS — D6489 Other specified anemias: Secondary | ICD-10-CM | POA: Diagnosis present

## 2019-09-05 HISTORY — DX: Type 2 diabetes mellitus without complications: E11.9

## 2019-09-05 HISTORY — DX: Tobacco use: Z72.0

## 2019-09-05 LAB — LACTIC ACID, PLASMA
Lactic Acid, Venous: 4.2 mmol/L (ref 0.5–1.9)
Lactic Acid, Venous: 3.5 mmol/L (ref 0.5–1.9)
Lactic Acid, Venous: 4.3 mmol/L (ref 0.5–1.9)

## 2019-09-05 LAB — POCT I-STAT EG7
Bicarbonate: 23.3 mmol/L (ref 20.0–28.0)
Calcium, Ion: 0.98 mmol/L — ABNORMAL LOW (ref 1.15–1.40)
HCT: 38 % — ABNORMAL LOW (ref 39.0–52.0)
Hemoglobin: 12.9 g/dL — ABNORMAL LOW (ref 13.0–17.0)
O2 Saturation: 81 %
Potassium: 2.9 mmol/L — ABNORMAL LOW (ref 3.5–5.1)
Sodium: 130 mmol/L — ABNORMAL LOW (ref 135–145)
TCO2: 24 mmol/L (ref 22–32)
pCO2, Ven: 33.4 mmHg — ABNORMAL LOW (ref 44.0–60.0)
pH, Ven: 7.452 — ABNORMAL HIGH (ref 7.250–7.430)
pO2, Ven: 43 mmHg (ref 32.0–45.0)

## 2019-09-05 LAB — CBC WITH DIFFERENTIAL/PLATELET
Abs Immature Granulocytes: 2.62 10*3/uL — ABNORMAL HIGH (ref 0.00–0.07)
Abs Immature Granulocytes: 1.76 10*3/uL — ABNORMAL HIGH (ref 0.00–0.07)
Basophils Absolute: 0.2 10*3/uL — ABNORMAL HIGH (ref 0.0–0.1)
Basophils Absolute: 0.2 10*3/uL — ABNORMAL HIGH (ref 0.0–0.1)
Basophils Relative: 1 %
Basophils Relative: 1 %
Eosinophils Absolute: 0 10*3/uL (ref 0.0–0.5)
Eosinophils Absolute: 0 10*3/uL (ref 0.0–0.5)
Eosinophils Relative: 0 %
Eosinophils Relative: 0 %
HCT: 37.7 % — ABNORMAL LOW (ref 39.0–52.0)
HCT: 34.1 % — ABNORMAL LOW (ref 39.0–52.0)
Hemoglobin: 12.7 g/dL — ABNORMAL LOW (ref 13.0–17.0)
Hemoglobin: 11.2 g/dL — ABNORMAL LOW (ref 13.0–17.0)
Immature Granulocytes: 11 %
Immature Granulocytes: 10 %
Lymphocytes Relative: 2 %
Lymphocytes Relative: 4 %
Lymphs Abs: 0.5 10*3/uL — ABNORMAL LOW (ref 0.7–4.0)
Lymphs Abs: 0.7 10*3/uL (ref 0.7–4.0)
MCH: 32.3 pg (ref 26.0–34.0)
MCH: 32.5 pg (ref 26.0–34.0)
MCHC: 33.7 g/dL (ref 30.0–36.0)
MCHC: 32.8 g/dL (ref 30.0–36.0)
MCV: 95.9 fL (ref 80.0–100.0)
MCV: 98.8 fL (ref 80.0–100.0)
Monocytes Absolute: 5.3 10*3/uL — ABNORMAL HIGH (ref 0.1–1.0)
Monocytes Absolute: 3.5 10*3/uL — ABNORMAL HIGH (ref 0.1–1.0)
Monocytes Relative: 22 %
Monocytes Relative: 19 %
Neutro Abs: 15.3 10*3/uL — ABNORMAL HIGH (ref 1.7–7.7)
Neutro Abs: 12 10*3/uL — ABNORMAL HIGH (ref 1.7–7.7)
Neutrophils Relative %: 64 %
Neutrophils Relative %: 66 %
Platelets: DECREASED 10*3/uL (ref 150–400)
Platelets: 53 10*3/uL — ABNORMAL LOW (ref 150–400)
RBC: 3.93 MIL/uL — ABNORMAL LOW (ref 4.22–5.81)
RBC: 3.45 MIL/uL — ABNORMAL LOW (ref 4.22–5.81)
RDW: 13.8 % (ref 11.5–15.5)
RDW: 14.2 % (ref 11.5–15.5)
WBC: 23.9 10*3/uL — ABNORMAL HIGH (ref 4.0–10.5)
WBC: 18.2 10*3/uL — ABNORMAL HIGH (ref 4.0–10.5)
nRBC: 0 % (ref 0.0–0.2)
nRBC: 0 % (ref 0.0–0.2)

## 2019-09-05 LAB — PROTIME-INR
INR: 1.3 — ABNORMAL HIGH (ref 0.8–1.2)
Prothrombin Time: 16.5 seconds — ABNORMAL HIGH (ref 11.4–15.2)

## 2019-09-05 LAB — MAGNESIUM: Magnesium: 2.3 mg/dL (ref 1.7–2.4)

## 2019-09-05 LAB — COMPREHENSIVE METABOLIC PANEL
ALT: 16 U/L (ref 0–44)
AST: 20 U/L (ref 15–41)
Albumin: 2.4 g/dL — ABNORMAL LOW (ref 3.5–5.0)
Alkaline Phosphatase: 116 U/L (ref 38–126)
Anion gap: 19 — ABNORMAL HIGH (ref 5–15)
BUN: 24 mg/dL — ABNORMAL HIGH (ref 8–23)
CO2: 22 mmol/L (ref 22–32)
Calcium: 8.5 mg/dL — ABNORMAL LOW (ref 8.9–10.3)
Chloride: 87 mmol/L — ABNORMAL LOW (ref 98–111)
Creatinine, Ser: 0.92 mg/dL (ref 0.61–1.24)
GFR calc Af Amer: >60 mL/min (ref >60)
GFR calc non Af Amer: >60 mL/min (ref >60)
Glucose, Bld: 355 mg/dL — ABNORMAL HIGH (ref 70–99)
Potassium: 3.1 mmol/L — ABNORMAL LOW (ref 3.5–5.1)
Sodium: 128 mmol/L — ABNORMAL LOW (ref 135–145)
Total Bilirubin: 1 mg/dL (ref 0.3–1.2)
Total Protein: 7.2 g/dL (ref 6.5–8.1)

## 2019-09-05 LAB — ACETAMINOPHEN LEVEL: Acetaminophen (Tylenol), Serum: <10 ug/mL — ABNORMAL LOW (ref 10–30)

## 2019-09-05 LAB — URINALYSIS, ROUTINE W REFLEX MICROSCOPIC
Bacteria, UA: NONE SEEN
Bilirubin Urine: NEGATIVE
Glucose, UA: >=500 mg/dL — AB
Ketones, ur: 20 mg/dL — AB
Leukocytes,Ua: NEGATIVE
Nitrite: NEGATIVE
Protein, ur: 100 mg/dL — AB
Specific Gravity, Urine: 1.029 (ref 1.005–1.030)
pH: 5 (ref 5.0–8.0)

## 2019-09-05 LAB — RESPIRATORY PANEL BY RT PCR (FLU A&B, COVID)
Influenza A by PCR: NEGATIVE
Influenza B by PCR: NEGATIVE
SARS Coronavirus 2 by RT PCR: NEGATIVE

## 2019-09-05 LAB — POC SARS CORONAVIRUS 2 AG -  ED: SARS Coronavirus 2 Ag: NEGATIVE

## 2019-09-05 LAB — TRIGLYCERIDES: Triglycerides: 77 mg/dL (ref <150)

## 2019-09-05 LAB — SALICYLATE LEVEL: Salicylate Lvl: <7 mg/dL — ABNORMAL LOW (ref 7.0–30.0)

## 2019-09-05 LAB — FIBRINOGEN: Fibrinogen: >800 mg/dL — ABNORMAL HIGH (ref 210–475)

## 2019-09-05 LAB — D-DIMER, QUANTITATIVE: D-Dimer, Quant: 3.12 ug/mL-FEU — ABNORMAL HIGH (ref 0.00–0.50)

## 2019-09-05 LAB — APTT: aPTT: 46 seconds — ABNORMAL HIGH (ref 24–36)

## 2019-09-05 LAB — LACTATE DEHYDROGENASE: LDH: 255 U/L — ABNORMAL HIGH (ref 98–192)

## 2019-09-05 LAB — PROCALCITONIN: Procalcitonin: 1.32 ng/mL

## 2019-09-05 MED ORDER — METOPROLOL TARTRATE 5 MG/5ML IV SOLN
5.0000 mg | Freq: Once | INTRAVENOUS | Status: AC
  Administered 2019-09-05: 5 mg via INTRAVENOUS
  Filled 2019-09-05: qty 5

## 2019-09-05 MED ORDER — FENTANYL CITRATE (PF) 100 MCG/2ML IJ SOLN
50.0000 ug | Freq: Once | INTRAMUSCULAR | Status: AC
  Administered 2019-09-05: 50 ug via INTRAVENOUS
  Filled 2019-09-05: qty 2

## 2019-09-05 MED ORDER — SODIUM CHLORIDE 0.9 % IV BOLUS (SEPSIS)
1000.0000 mL | Freq: Once | INTRAVENOUS | Status: AC
  Administered 2019-09-05: 1000 mL via INTRAVENOUS

## 2019-09-05 MED ORDER — DILTIAZEM HCL 25 MG/5ML IV SOLN
10.0000 mg | Freq: Once | INTRAVENOUS | Status: AC
  Administered 2019-09-05: 10 mg via INTRAVENOUS

## 2019-09-05 MED ORDER — PANTOPRAZOLE SODIUM 40 MG IV SOLR
40.0000 mg | Freq: Once | INTRAVENOUS | Status: AC
  Administered 2019-09-05: 40 mg via INTRAVENOUS
  Filled 2019-09-05: qty 40

## 2019-09-05 MED ORDER — MAGNESIUM SULFATE IN D5W 1-5 GM/100ML-% IV SOLN
1.0000 g | Freq: Once | INTRAVENOUS | Status: AC
  Administered 2019-09-05: 1 g via INTRAVENOUS
  Filled 2019-09-05: qty 100

## 2019-09-05 MED ORDER — VANCOMYCIN HCL 10 G IV SOLR
2500.0000 mg | Freq: Once | INTRAVENOUS | Status: AC
  Administered 2019-09-05: 2500 mg via INTRAVENOUS
  Filled 2019-09-05: qty 2500

## 2019-09-05 MED ORDER — SODIUM CHLORIDE 0.9 % IV SOLN
2.0000 g | Freq: Two times a day (BID) | INTRAVENOUS | Status: DC
  Administered 2019-09-06 (×2): 2 g via INTRAVENOUS
  Filled 2019-09-05: qty 2
  Filled 2019-09-05: qty 20
  Filled 2019-09-05: qty 2
  Filled 2019-09-05: qty 20

## 2019-09-05 MED ORDER — DILTIAZEM HCL 25 MG/5ML IV SOLN
20.0000 mg | Freq: Once | INTRAVENOUS | Status: AC
  Administered 2019-09-05: 20 mg via INTRAVENOUS
  Filled 2019-09-05: qty 5

## 2019-09-05 MED ORDER — ONDANSETRON HCL 4 MG/2ML IJ SOLN
4.0000 mg | Freq: Once | INTRAMUSCULAR | Status: AC
  Administered 2019-09-05: 4 mg via INTRAVENOUS
  Filled 2019-09-05: qty 2

## 2019-09-05 MED ORDER — VANCOMYCIN HCL 1500 MG/300ML IV SOLN
1500.0000 mg | Freq: Two times a day (BID) | INTRAVENOUS | Status: DC
  Filled 2019-09-05 (×2): qty 300

## 2019-09-05 MED ORDER — ADENOSINE 6 MG/2ML IV SOLN
6.0000 mg | Freq: Once | INTRAVENOUS | Status: AC
  Administered 2019-09-05: 6 mg via INTRAVENOUS
  Filled 2019-09-05: qty 2

## 2019-09-05 MED ORDER — SODIUM CHLORIDE 0.9 % IV BOLUS
500.0000 mL | Freq: Once | INTRAVENOUS | Status: AC
  Administered 2019-09-05: 500 mL via INTRAVENOUS

## 2019-09-05 MED ORDER — POTASSIUM CHLORIDE CRYS ER 20 MEQ PO TBCR
40.0000 meq | EXTENDED_RELEASE_TABLET | Freq: Once | ORAL | Status: AC
  Administered 2019-09-05: 40 meq via ORAL
  Filled 2019-09-05: qty 2

## 2019-09-05 MED ORDER — SODIUM CHLORIDE 0.9 % IV SOLN
2.0000 g | Freq: Once | INTRAVENOUS | Status: AC
  Administered 2019-09-05: 2 g via INTRAVENOUS
  Filled 2019-09-05: qty 20

## 2019-09-05 MED ORDER — DILTIAZEM HCL-DEXTROSE 125-5 MG/125ML-% IV SOLN (PREMIX)
5.0000 mg/h | INTRAVENOUS | Status: DC
  Administered 2019-09-05: 5 mg/h via INTRAVENOUS
  Filled 2019-09-05: qty 125

## 2019-09-05 MED ORDER — DILTIAZEM HCL-DEXTROSE 125-5 MG/125ML-% IV SOLN (PREMIX)
5.0000 mg/h | INTRAVENOUS | Status: DC
  Administered 2019-09-05: 5 mg/h via INTRAVENOUS
  Administered 2019-09-06 – 2019-09-07 (×5): 15 mg/h via INTRAVENOUS
  Filled 2019-09-05 (×6): qty 125

## 2019-09-05 MED ORDER — VANCOMYCIN HCL 1500 MG/300ML IV SOLN
1500.0000 mg | Freq: Two times a day (BID) | INTRAVENOUS | Status: DC
  Filled 2019-09-05: qty 300

## 2019-09-05 MED ORDER — SODIUM CHLORIDE 0.9 % IV SOLN
2.0000 g | INTRAVENOUS | Status: DC
  Administered 2019-09-05 – 2019-09-06 (×2): 2 g via INTRAVENOUS
  Filled 2019-09-05: qty 2000
  Filled 2019-09-05: qty 2
  Filled 2019-09-05 (×2): qty 2000
  Filled 2019-09-05: qty 2
  Filled 2019-09-05 (×2): qty 2000

## 2019-09-05 MED ORDER — DILTIAZEM LOAD VIA INFUSION
10.0000 mg | Freq: Once | INTRAVENOUS | Status: AC
  Administered 2019-09-05: 10 mg via INTRAVENOUS
  Filled 2019-09-05: qty 10

## 2019-09-05 MED ORDER — SODIUM CHLORIDE 0.9 % IV BOLUS
1000.0000 mL | Freq: Once | INTRAVENOUS | Status: AC
  Administered 2019-09-06: 1000 mL via INTRAVENOUS

## 2019-09-05 MED ORDER — FAMOTIDINE IN NACL 20-0.9 MG/50ML-% IV SOLN
20.0000 mg | Freq: Two times a day (BID) | INTRAVENOUS | Status: DC
  Administered 2019-09-06 (×3): 20 mg via INTRAVENOUS
  Filled 2019-09-05 (×3): qty 50

## 2019-09-05 MED ORDER — POTASSIUM CHLORIDE 10 MEQ/100ML IV SOLN
10.0000 meq | Freq: Once | INTRAVENOUS | Status: AC
  Administered 2019-09-05: 10 meq via INTRAVENOUS
  Filled 2019-09-05: qty 100

## 2019-09-05 MED ORDER — VANCOMYCIN HCL IN DEXTROSE 1-5 GM/200ML-% IV SOLN: 1000.0000 mg | Freq: Once | INTRAVENOUS | Status: DC

## 2019-09-05 NOTE — H&P (Addendum)
History and Physical    Chijioke Lasser SAY:[id number] DOB: Oct 17, 1955 DOA: 09/06/2019  PCP: Patient, No Pcp Per   Patient coming from: Home   Chief Complaint: Nausea, SOB, cough, neck pain, fevers   HPI: Athanasios Heldman is a 64 y.o. male with medical history significant for type 2 diabetes mellitus not currently on any medications, now presenting to the ED complaining of nausea, shortness of breath, cough, neck pain, headache, fevers, and chills for the past week.  Patient reports that symptoms began with some mild malaise, general aches, and then cough.  He has gone on to develop a headache and neck pain that is worse with movement in either direction or flexion/extension.  Cough has been nonproductive.  He denies any abdominal pain, dysuria, or wounds.  Denies any bleeding or bruising.  He reports that a bilateral lower extremity rash has been unchanged for years.  Denies any alcohol or illicit substance use.  Reports that he quit smoking today.  ED Course: Upon arrival to the ED, patient is found to be afebrile, saturating low 90s on room air, tachypneic in the low 30s, and tachycardic to the 170s with SBP 91.  EKG looks like SVT with rate 185.  Chest x-ray negative for acute cardiopulmonary disease.  Urinalysis with glucosuria, ketonuria, and proteinuria.  Head CT negative for acute intracranial abnormality.  Chemistry panel with glucose 355, sodium 128, and potassium 3.1.  CBC with leukocytosis to 23,900, mild normocytic anemia, and clumped platelets.  Lactic acid was 4.2.  Patient continued to refuse lumbar puncture in the ED.  Blood cultures were collected and he was treated with adenosine, 30 cc/kg normal saline, 2 g IV Rocephin, ampicillin, and vancomycin.  Heart rate slowed and looked more like atrial fibrillation on the monitor and the patient was given diltiazem bolus and started on diltiazem infusion.  Review of Systems:  All other systems reviewed and apart from HPI, are negative.  History  reviewed. No pertinent past medical history.  History reviewed. No pertinent surgical history.   reports that he has been smoking cigarettes. He has a 49.00 pack-year smoking history. He has never used smokeless tobacco. He reports that he does not drink alcohol or use drugs.  No Known Allergies  History reviewed. No pertinent family history.   Prior to Admission medications   Medication Sig Start Date End Date Taking? Authorizing Provider  Aspirin-Caffeine (BAYER BACK & BODY PAIN EX ST) 500-32.5 MG TABS Take 2 tablets by mouth every 5 (five) hours as needed (for pain).    Yes [provider]  metFORMIN (GLUCOPHAGE) 500 MG tablet Take 1 tablet (500 mg total) by mouth 2 (two) times daily with a meal. Patient not taking: Reported on 08/22/2019 09/01/13   Riebock, Anette Riedel, NP  oxyCODONE 10 MG TABS Take 1-2 tablets (10-20 mg total) by mouth every 4 (four) hours as needed (10mg  for mild pain, 15mg  for moderate pain, 20mg  for severe pain). Patient not taking: Reported on 09/07/2019 09/01/13   , NP    Physical Exam: Vitals:   08/14/2019 2215 08/16/2019 2230 08/22/2019 2234 08/20/2019 2245  BP: 97/81 98/83 98/83  105/80  Pulse:   (!) 152 97  Resp: (!) 29 20 (!) 22 (!) 35  Temp:      TempSrc:      SpO2:   92% 95%  Weight:      Height:        Constitutional: NAD, calm  Eyes: PERTLA, lids and conjunctivae normal ENMT: Mucous membranes  are moist. Posterior pharynx clear of any exudate or lesions.   Neck: normal, supple, no masses, no thyromegaly Respiratory: Tachypnea, no wheezing. No accessory muscle use.  Cardiovascular: Rate ~140 and irregularly irregular. No extremity edema. Abdomen: No distension, no tenderness, soft. Bowel sounds active.  Musculoskeletal: neck pain with movement. No joint deformity upper and lower extremities.   Skin: superficial excoriations involving lower legs bilaterally. Warm, dry, well-perfused. Neurologic: CN 2-12 grossly intact. Sensation intact.  Strength 5/5 in all 4 limbs.  Psychiatric: Alert and oriented x 3. Calm and cooperative.    Labs and Imaging on Admission: I have personally reviewed following labs and imaging studies  CBC: Recent Labs  Lab 09-12-19 1616 September 12, 2019 1628 12-Sep-2019 2209  WBC 23.9*  --  18.2*  NEUTROABS 15.3*  --  12.0*  HGB 12.7* 12.9* 11.2*  HCT 37.7* 38.0* 34.1*  MCV 95.9  --  98.8  PLT PLATELET CLUMPS NOTED ON SMEAR, COUNT APPEARS DECREASED  --  53*   Basic Metabolic Panel: Recent Labs  Lab 09-12-2019 1616 Sep 12, 2019 1628 09/12/2019 2209  NA 128* 130*  --   K 3.1* 2.9*  --   CL 87*  --   --   CO2 22  --   --   GLUCOSE 355*  --   --   BUN 24*  --   --   CREATININE 0.92  --   --   CALCIUM 8.5*  --   --   MG  --   --  2.3   GFR: Estimated Creatinine Clearance: 100.9 mL/min (by C-G formula based on SCr of 0.92 mg/dL). Liver Function Tests: Recent Labs  Lab 12-Sep-2019 1616  AST 20  ALT 16  ALKPHOS 116  BILITOT 1.0  PROT 7.2  ALBUMIN 2.4*   No results for input(s): LIPASE, AMYLASE in the last 168 hours. No results for input(s): AMMONIA in the last 168 hours. Coagulation Profile: Recent Labs  Lab 2019-09-12 1847  INR 1.3*   Cardiac Enzymes: No results for input(s): CKTOTAL, CKMB, CKMBINDEX, TROPONINI in the last 168 hours. BNP (last 3 results) No results for input(s): PROBNP in the last 8760 hours. HbA1C: No results for input(s): HGBA1C in the last 72 hours. CBG: No results for input(s): GLUCAP in the last 168 hours. Lipid Profile: Recent Labs    2019/09/12 1616  TRIG 77   Thyroid Function Tests: No results for input(s): TSH, T4TOTAL, FREET4, T3FREE, THYROIDAB in the last 72 hours. Anemia Panel: No results for input(s): VITAMINB12, FOLATE, FERRITIN, TIBC, IRON, RETICCTPCT in the last 72 hours. Urine analysis:    Component Value Date/Time   COLORURINE YELLOW 2019-09-12 2048   APPEARANCEUR HAZY (A) 2019/09/12 2048   LABSPEC 1.029 09-12-19 2048   PHURINE 5.0 12-Sep-2019 2048    GLUCOSEU >=500 (A) 2019/09/12 2048   HGBUR MODERATE (A) 12-Sep-2019 2048   BILIRUBINUR NEGATIVE 09-12-2019 2048   KETONESUR 20 (A) 09/12/2019 2048   PROTEINUR 100 (A) Sep 12, 2019 2048   UROBILINOGEN 0.2 08/30/2013 1006   NITRITE NEGATIVE 2019/09/12 2048   LEUKOCYTESUR NEGATIVE 09/12/19 2048   Sepsis Labs: @LABRCNTIP (procalcitonin:4,lacticidven:4) ) Recent Results (from the past 240 hour(s))  Respiratory Panel by RT PCR (Flu A&B, Covid) - Nasopharyngeal Swab     Status: None   Collection Time: 09-12-19  5:40 PM   Specimen: Nasopharyngeal Swab  Result Value Ref Range Status   SARS Coronavirus 2 by RT PCR NEGATIVE NEGATIVE Final    Comment: (NOTE) SARS-CoV-2 target nucleic acids are NOT DETECTED. The  SARS-CoV-2 RNA is generally detectable in upper respiratoy specimens during the acute phase of infection. The lowest concentration of SARS-CoV-2 viral copies this assay can detect is 131 copies/mL. A negative result does not preclude SARS-Cov-2 infection and should not be used as the sole basis for treatment or other patient management decisions. A negative result may occur with  improper specimen collection/handling, submission of specimen other than nasopharyngeal swab, presence of viral mutation(s) within the areas targeted by this assay, and inadequate number of viral copies (<131 copies/mL). A negative result must be combined with clinical observations, patient history, and epidemiological information. The expected result is Negative. Fact Sheet for Patients:  https://www.moore.com/ Fact Sheet for Healthcare Providers:  https://www.young.biz/ This test is not yet ap proved or cleared by the [place] FDA and  has been authorized for detection and/or diagnosis of SARS-CoV-2 by FDA under an Emergency Use Authorization (EUA). This EUA will remain  in effect (meaning this test can be used) for the duration of the COVID-19 declaration  under Section 564(b)(1) of the Act, 21 U.S.C. section 360bbb-3(b)(1), unless the authorization is terminated or revoked sooner.    Influenza A by PCR NEGATIVE NEGATIVE Final   Influenza B by PCR NEGATIVE NEGATIVE Final    Comment: (NOTE) The Xpert Xpress SARS-CoV-2/FLU/RSV assay is intended as an aid in  the diagnosis of influenza from Nasopharyngeal swab specimens and  should not be used as a sole basis for treatment. Nasal washings and  aspirates are unacceptable for Xpert Xpress SARS-CoV-2/FLU/RSV  testing. Fact Sheet for Patients: https://www.moore.com/ Fact Sheet for Healthcare Providers: https://www.young.biz/ This test is not yet approved or cleared by the [place] FDA and  has been authorized for detection and/or diagnosis of SARS-CoV-2 by  FDA under an Emergency Use Authorization (EUA). This EUA will remain  in effect (meaning this test can be used) for the duration of the  Covid-19 declaration under Section 564(b)(1) of the Act, 21  U.S.C. section 360bbb-3(b)(1), unless the authorization is  terminated or revoked. Performed at [facility], [address] [place], [place] [address]      Radiological Exams on Admission: CT Head Wo Contrast  Result Date: 09-10-2019 CLINICAL DATA:  Headache EXAM: CT HEAD WITHOUT CONTRAST TECHNIQUE: Contiguous axial images were obtained from the base of the skull through the vertex without intravenous contrast. COMPARISON:  None. FINDINGS: Brain: Mild age related volume loss. No acute intracranial abnormality. Specifically, no hemorrhage, hydrocephalus, mass lesion, acute infarction, or significant intracranial injury. Vascular: No hyperdense vessel or unexpected calcification. Skull: No acute calvarial abnormality. Sinuses/Orbits: Visualized paranasal sinuses and mastoids clear. Orbital soft tissues unremarkable. Other: None IMPRESSION: No acute intracranial abnormality. Electronically Signed    By: Charlett Nose M.D.   On: 09/10/2019 19:54   DG Chest Port 1 View  Result Date: 09-10-19 CLINICAL DATA:  Cough, fever EXAM: PORTABLE CHEST 1 VIEW COMPARISON:  09/01/2013 FINDINGS: Single frontal view of the chest demonstrates a stable cardiac silhouette. There are multiple prior healed left rib fractures. Linear consolidation at the left lung base likely reflects scarring. No airspace disease, effusion, or pneumothorax. IMPRESSION: 1. No acute intrathoracic process. Electronically Signed   By: Sharlet Salina M.D.   On: September 10, 2019 16:38    EKG: Independently reviewed. SVT, rate 185.   Assessment/Plan   1. Severe sepsis  - Presents with a week of fevers, non-productive cough, and generalized aches but most severe in neck and head  - CXR looks clear, COVID & flu pcr  neg, UA not suggestive of infection, abd exam benign, neck pain with flexion/extension  - He refused LP in ED, blood cultures were collected, 30 cc/kg NS bolused, and empiric treatment started with vancomycin, ampicillin, and Rocephin 2g q12h  - Continue fluid-resuscitation, continue current antibiotics, droplet precautions, trend lactate, consult with PCCM given critical illness, follow cultures and clinical course   2. Atrial fibrillation with RVR  - HR was 185 initially, treated with adenosine, then a fib seen on monitor with HR persisting 150s  - He was given IV diltiazem boluses, IV Lopressor, and diltiazem infusion in ED  - He denies hx of a fib  - Continue diltiazem infusion, replace potassium to 4 and mag 2, check TSH, echo when stable, treat underlying illness, no anticoagulation for now with low platelets    3. Type II DM  - He was diagnosed with DM years ago but not taking any medications  - Serum glucose 355 in ED  - Check CBGs and start low-intensity SSI with Novolog    4. Thrombocytopenia  - Platelets 53k without bleeding  - Likely secondary to sepsis, continue abx, type & screen, repeat CBC in am    5.  Hypokalemia  - Serum potassium is 2.9 in ED, treated with 40 mEq oral and 10 mEq IV potassium, also given empiric mag in light of rapid a fib  - Repeat chemistries   6. Hyponatremia  - Serum sodium 128, corrects to 133 when accounting for hyperglycemia  - Continue fluid-resuscitation with isotonic IVF and repeat chem panel    DVT prophylaxis: SCDs Code Status: Full  Family Communication: Discussed with patient  Disposition Plan: Patient critically-ill, disposition pending clinical course  Consults called: PCCM  Admission status: Inpatient     Vianne Bulls, MD [facility] Pager: See [email or web address]  If 7AM-7PM, please contact the daytime attending [email or web address]  September 06, 2019, 11:33 PM

## 2019-09-05 NOTE — ED Provider Notes (Signed)
[facility] EMERGENCY DEPARTMENT Provider Note   CSN: [id number] Arrival date & time: 2019-09-26  1555     History Chief Complaint  Patient presents with  . Weakness    Derek [name] is a 64 y.o. male.  Patient is a 64 year old male with a history of diabetes mellitus who is not on medication who presents with fever and cough.  He says been feeling bad for about a week.  It started with a headache and he said that his scalp hurt.  He then had diffuse body aches.  He has some soreness across the sides of his neck.  No spinal pain per his report.  He has pain across his back and arms and legs.  He has a slight cough.  Has a sore throat and some rhinorrhea.  He is also had some ongoing nausea and vomiting has not really been able to keep anything down.  He denies any diarrhea.  He has had reported fevers at home.  He was diagnosed with diabetes during a prior hospitalization for traumatic pneumothorax.  He did not like being on medication so he has not been taking medication for this.  He does not have a PCP.        History reviewed. No pertinent past medical history.  Patient Active Problem List   Diagnosis Date Noted  . Sepsis ([place]) 26-Sep-2019  . Atrial fibrillation with RVR ([place]) 09/26/19  . Hypokalemia 26-Sep-2019  . Hyponatremia 09-26-2019  . Fall 08/31/2013  . Multiple fractures of ribs of left side 08/31/2013  . Chronic nasal congestion 08/31/2013  . DM (diabetes mellitus) ([place]) 08/31/2013  . Pneumothorax, closed, traumatic 08/30/2013    History reviewed. No pertinent surgical history.     History reviewed. No pertinent family history.  Social History   Tobacco Use  . Smoking status: Current Every Day Smoker    Packs/day: 1.00    Years: 49.00    Pack years: 49.00    Types: Cigarettes  . Smokeless tobacco: Never Used  Substance Use Topics  . Alcohol use: No  . Drug use: No    Home Medications Prior to Admission medications   Medication  Sig Start Date End Date Taking? Authorizing Provider  aspirin 325 MG tablet Take 325 mg by mouth daily as needed for mild pain.    [provider]  Calcium Carbonate Antacid (ANTACID PO) Take 1 tablet by mouth every 4 (four) hours as needed (acid reflux).    [provider]  metFORMIN (GLUCOPHAGE) 500 MG tablet Take 1 tablet (500 mg total) by mouth 2 (two) times daily with a meal. 09/01/13   Riebock, Emina, NP  oxyCODONE 10 MG TABS Take 1-2 tablets (10-20 mg total) by mouth every 4 (four) hours as needed (10mg  for mild pain, 15mg  for moderate pain, 20mg  for severe pain). 09/01/13   Riebock, Estill Bakes, NP  phenylephrine (NEO-SYNEPHRINE) 1 % nasal spray Place 2 drops into both nostrils every 6 (six) hours as needed for congestion.    [provider]    Allergies    Patient has no known allergies.  Review of Systems   Review of Systems  Constitutional: Positive for chills, fatigue and fever. Negative for diaphoresis.  HENT: Positive for congestion and rhinorrhea. Negative for sneezing.   Eyes: Negative.   Respiratory: Positive for cough and shortness of breath. Negative for chest tightness.   Cardiovascular: Negative for chest pain and leg swelling.  Gastrointestinal: Positive for nausea and vomiting. Negative for abdominal  pain, blood in stool and diarrhea.  Genitourinary: Negative for difficulty urinating, flank pain, frequency and hematuria.  Musculoskeletal: Positive for back pain, myalgias and neck pain. Negative for arthralgias.  Skin: Negative for rash.  Neurological: Positive for headaches. Negative for dizziness, speech difficulty, weakness and numbness.    Physical Exam Updated Vital Signs BP 90/76   Pulse (!) 152   Temp 99.5 F (37.5 C) (Oral)   Resp (!) 24   Ht 6\' 1"  (1.854 m)   Wt 99.8 kg   SpO2 94%   BMI 29.03 kg/m   Physical Exam Constitutional:      Appearance: He is well-developed.  HENT:     Head: Normocephalic and atraumatic.  Eyes:      Pupils: Pupils are equal, round, and reactive to light.  Neck:     Comments: Soreness across the trapezius muscles bilaterally Cardiovascular:     Rate and Rhythm: Regular rhythm. Tachycardia present.     Heart sounds: Normal heart sounds.  Pulmonary:     Effort: Pulmonary effort is normal. No respiratory distress.     Breath sounds: Normal breath sounds. No wheezing or rales.  Chest:     Chest wall: No tenderness.  Abdominal:     General: Bowel sounds are normal.     Palpations: Abdomen is soft.     Tenderness: There is no abdominal tenderness. There is no guarding or rebound.  Musculoskeletal:        General: Normal range of motion.     Cervical back: Normal range of motion.  Lymphadenopathy:     Cervical: No cervical adenopathy.  Skin:    General: Skin is warm and dry.     Findings: No rash.  Neurological:     Mental Status: He is alert and oriented to person, place, and time.     ED Results / Procedures / Treatments   Labs (all labs ordered are listed, but only abnormal results are displayed) Labs Reviewed  LACTIC ACID, PLASMA - Abnormal; Notable for the following components:      Result Value   Lactic Acid, Venous 4.2 (*)    All other components within normal limits  LACTIC ACID, PLASMA - Abnormal; Notable for the following components:   Lactic Acid, Venous 3.5 (*)    All other components within normal limits  CBC WITH DIFFERENTIAL/PLATELET - Abnormal; Notable for the following components:   WBC 23.9 (*)    RBC 3.93 (*)    Hemoglobin 12.7 (*)    HCT 37.7 (*)    Neutro Abs 15.3 (*)    Lymphs Abs 0.5 (*)    Monocytes Absolute 5.3 (*)    Basophils Absolute 0.2 (*)    Abs Immature Granulocytes 2.62 (*)    All other components within normal limits  COMPREHENSIVE METABOLIC PANEL - Abnormal; Notable for the following components:   Sodium 128 (*)    Potassium 3.1 (*)    Chloride 87 (*)    Glucose, Bld 355 (*)    BUN 24 (*)    Calcium 8.5 (*)    Albumin 2.4 (*)      Anion gap 19 (*)    All other components within normal limits  D-DIMER, QUANTITATIVE (NOT AT [facility]) - Abnormal; Notable for the following components:   D-Dimer, Quant 3.12 (*)    All other components within normal limits  LACTATE DEHYDROGENASE - Abnormal; Notable for the following components:   LDH 255 (*)    All other components within  normal limits  FIBRINOGEN - Abnormal; Notable for the following components:   Fibrinogen >800 (*)    All other components within normal limits  APTT - Abnormal; Notable for the following components:   aPTT 46 (*)    All other components within normal limits  PROTIME-INR - Abnormal; Notable for the following components:   Prothrombin Time 16.5 (*)    INR 1.3 (*)    All other components within normal limits  URINALYSIS, ROUTINE W REFLEX MICROSCOPIC - Abnormal; Notable for the following components:   APPearance HAZY (*)    Glucose, UA >=500 (*)    Hgb urine dipstick MODERATE (*)    Ketones, ur 20 (*)    Protein, ur 100 (*)    All other components within normal limits  POCT I-STAT EG7 - Abnormal; Notable for the following components:   pH, Ven 7.452 (*)    pCO2, Ven 33.4 (*)    Sodium 130 (*)    Potassium 2.9 (*)    Calcium, Ion 0.98 (*)    HCT 38.0 (*)    Hemoglobin 12.9 (*)    All other components within normal limits  RESPIRATORY PANEL BY RT PCR (FLU A&B, COVID)  CULTURE, BLOOD (ROUTINE X 2)  CULTURE, BLOOD (ROUTINE X 2)  URINE CULTURE  PROCALCITONIN  TRIGLYCERIDES  MAGNESIUM  C-REACTIVE PROTEIN  FERRITIN  PATHOLOGIST SMEAR REVIEW  SALICYLATE LEVEL  ACETAMINOPHEN LEVEL  LACTIC ACID, PLASMA  LACTIC ACID, PLASMA  MAGNESIUM  CBC WITH DIFFERENTIAL/PLATELET  POC SARS CORONAVIRUS 2 AG -  ED    EKG EKG Interpretation  Date/Time:  Wednesday September 05 2019 17:48:37 EDT Ventricular Rate:  176 PR Interval:    QRS Duration: 74 QT Interval:  284 QTC Calculation: 486 R Axis:   77 Text Interpretation: Supraventricular tachycardia Low  voltage, precordial leads Minimal ST depression Nonspecific T abnormalities, anterior leads Confirmed by Rolan Bucco [phone]) on 08/18/2019 5:52:06 PM   Radiology CT Head Wo Contrast  Result Date: 08/31/2019 CLINICAL DATA:  Headache EXAM: CT HEAD WITHOUT CONTRAST TECHNIQUE: Contiguous axial images were obtained from the base of the skull through the vertex without intravenous contrast. COMPARISON:  None. FINDINGS: Brain: Mild age related volume loss. No acute intracranial abnormality. Specifically, no hemorrhage, hydrocephalus, mass lesion, acute infarction, or significant intracranial injury. Vascular: No hyperdense vessel or unexpected calcification. Skull: No acute calvarial abnormality. Sinuses/Orbits: Visualized paranasal sinuses and mastoids clear. Orbital soft tissues unremarkable. Other: None IMPRESSION: No acute intracranial abnormality. Electronically Signed   By: Charlett Nose M.D.   On: 08/14/2019 19:54   DG Chest Port 1 View  Result Date: 08/25/2019 CLINICAL DATA:  Cough, fever EXAM: PORTABLE CHEST 1 VIEW COMPARISON:  09/01/2013 FINDINGS: Single frontal view of the chest demonstrates a stable cardiac silhouette. There are multiple prior healed left rib fractures. Linear consolidation at the left lung base likely reflects scarring. No airspace disease, effusion, or pneumothorax. IMPRESSION: 1. No acute intrathoracic process. Electronically Signed   By: Sharlet Salina M.D.   On: 09/02/2019 16:38    Procedures Procedures (including critical care time)  Medications Ordered in ED Medications  vancomycin (VANCOCIN) 2,500 mg in sodium chloride 0.9 % 500 mL IVPB (2,500 mg Intravenous New Bag/Given 08/19/2019 2115)  ampicillin (OMNIPEN) 2 g in sodium chloride 0.9 % 100 mL IVPB (0 g Intravenous Stopped 09/12/2019 2108)  cefTRIAXone (ROCEPHIN) 2 g in sodium chloride 0.9 % 100 mL IVPB (has no administration in time range)  vancomycin (VANCOREADY) IVPB 1500 mg/300 mL (has  no administration in time  range)  potassium chloride SA (KLOR-CON) CR tablet 40 mEq (has no administration in time range)  potassium chloride 10 mEq in 100 mL IVPB (has no administration in time range)  magnesium sulfate IVPB 1 g 100 mL (has no administration in time range)  diltiazem (CARDIZEM) 1 mg/mL load via infusion 10 mg (has no administration in time range)    And  diltiazem (CARDIZEM) 125 mg in dextrose 5% 125 mL (1 mg/mL) infusion (has no administration in time range)  sodium chloride 0.9 % bolus 500 mL (0 mLs Intravenous Stopped 02-Oct-2019 1807)  fentaNYL (SUBLIMAZE) injection 50 mcg (50 mcg Intravenous Given 10-02-2019 1728)  pantoprazole (PROTONIX) injection 40 mg (40 mg Intravenous Given 2019/10/02 1728)  ondansetron (ZOFRAN) injection 4 mg (4 mg Intravenous Given 2019/10/02 1728)  adenosine (ADENOCARD) 6 MG/2ML injection 6 mg (6 mg Intravenous Given 02-Oct-2019 1750)  diltiazem (CARDIZEM) injection 20 mg (20 mg Intravenous Given 02-Oct-2019 1807)  sodium chloride 0.9 % bolus 1,000 mL (0 mLs Intravenous Stopped Oct 02, 2019 1932)    And  sodium chloride 0.9 % bolus 1,000 mL (0 mLs Intravenous Stopped 2019-10-02 2045)    And  sodium chloride 0.9 % bolus 1,000 mL (0 mLs Intravenous Stopped 10/02/19 2102)  cefTRIAXone (ROCEPHIN) 2 g in sodium chloride 0.9 % 100 mL IVPB (0 g Intravenous Stopped 2019/10/02 2045)  diltiazem (CARDIZEM) injection 10 mg (10 mg Intravenous Given Oct 02, 2019 1850)  metoprolol tartrate (LOPRESSOR) injection 5 mg (5 mg Intravenous Given 2019-10-02 2108)    ED Course  I have reviewed the triage vital signs and the nursing notes.  Pertinent labs & imaging results that were available during my care of the patient were reviewed by me and considered in my medical decision making (see chart for details).    MDM Rules/Calculators/A&P                      Patient is a 64 year old male who presents with mild cough and congestion with diffuse body aches associated with a headache and neck pain.  Initially I was suspicious  for Covid.  He had some mild shortness of breath.  His labs initially look concerning for Covid with an increased fibrinogen, LDH and D-dimer.  However his rapid antigen test was negative.  I did do a viral respiratory panel which was also negative for Covid and influenza.  Given this in association with elevated lactate and WBC count, at this point, I felt that he needed to be treated aggressively for possible sepsis.  He was started on IV fluids at 30 cc/kg.  He had previously been started on IV fluids but not at the full 30 cc/kg.  He was started on broad-spectrum antibiotics.  I did feel in the absence of an obvious source of infection, including a chest x-ray which shows no evidence of pneumonia, meningitis needed to be assessed for.  I talked with the patient about doing a spinal tap given his headache and neck pain.  At this point, he is refusing a spinal tap.  I did advise him that if he does have meningitis, the sooner we know about it, the more treatable it is.  I did advise him that this could be life-threatening.  He is still refusing it as he has had multiple family members who have had bad experiences with both spinal taps and epidurals.  He wants to wait for about 24 hours and see how he feels following that.  I did  advise him again that the earlier you can diagnose meningitis, the better outcomes people have and that it can be lethal at times.  Still refusing a spinal tap.  I did do a head CT given his significant headache which shows no acute abnormality.  His urine is still pending.  His lactate has improved from 4.2-3.5 with IV fluids.  His white count is elevated at 23,000.  His sodium is mildly low as well as his potassium.  He also was noted to be in SVT on arrival with a heart rate in the 170s 180s.  Initially appeared to be very regular and was given IV fluids and Adenosine.  The adenosine slowed it briefly but it went back into SVT.  It did look a little more regular once it slowed and I  gave him Cardizem.  He got to bolus doses of Cardizem was started on Cardizem drip.  His heart rate is improving with this slowly.  His heart rate has improved from the 180s into the 150s.  His blood pressure has been soft but is currently remaining in the low 100s systolic.  I did stop the Cardizem drip and gave patient 5 mg of Lopressor.  This brought his heart rate down to the 130s.  He was feeling better.  I spoke with Dr. Antionette Char who will admit the patient for further treatment.  CRITICAL CARE Performed by: Rolan Bucco Total critical care time: 80 minutes Critical care time was exclusive of separately billable procedures and treating other patients. Critical care was necessary to treat or prevent imminent or life-threatening deterioration. Critical care was time spent personally by me on the following activities: development of treatment plan with patient and/or surrogate as well as nursing, discussions with consultants, evaluation of patient's response to treatment, examination of patient, obtaining history from patient or surrogate, ordering and performing treatments and interventions, ordering and review of laboratory studies, ordering and review of radiographic studies, pulse oximetry and re-evaluation of patient's condition.  Final Clinical Impression(s) / ED Diagnoses Final diagnoses:  Sepsis without acute organ dysfunction, due to unspecified organism [facility])  Atrial fibrillation with RVR [facility])    Rx / DC Orders ED Discharge Orders    None       Rolan Bucco, MD 09-23-2019 2201

## 2019-09-05 NOTE — ED Triage Notes (Signed)
Pt presents with c/o nausea, SOB, neck pain, fever, body aches, and cough x1 week. Pt A+Ox4, skin warm and dry. Pt endorses hx of DM, states he does not take medication. States he does not go to the doctor regularly because He "doesn't get sick". Pt HR 180 on arrival to ED, pt endorses CP x3 weeks.

## 2019-09-05 NOTE — Progress Notes (Addendum)
Pharmacy Antibiotic Note  Derek [name] is a 64 y.o. male admitted on 09-13-19 with meningitis. Pharmacy has been consulted for vancomycin dosing.  Pt presents with c/o nausea, SOB, neck pain, fever, body aches, and cough x1 week.   Plan: Vancomycin 2500mg  x1 then 1500mg  IV Q12h Goal AUC 400-550 Expected AUC: 519 SCr used: 0.92 F/u clinical progress, c/s, de-escalation, and LOT  Height: 6\' 1"  (185.4 cm) Weight: 220 lb (99.8 kg) IBW/kg (Calculated) : 79.9  Temp (24hrs), Avg:99.5 F (37.5 C), Min:99.5 F (37.5 C), Max:99.5 F (37.5 C)  Recent Labs  Lab September 13, 2019 1608 09/13/2019 1616  WBC  --  23.9*  CREATININE  --  0.92  LATICACIDVEN 4.2*  --     Estimated Creatinine Clearance: 100.9 mL/min (by C-G formula based on SCr of 0.92 mg/dL).    No Known Allergies  Antimicrobials this admission: 3/24 vanc >>  3/24 ceftriaxone >>  3/24 ampicillin >>  Dose adjustments this admission: N/A  Microbiology results: 3/24 BCx: sent 3/24 UCx: sent   Thank you for allowing pharmacy to be a part of this patient's care.  4/24, PharmD PGY1 Ambulatory Care Pharmacy Resident Sep 13, 2019 7:10 PM

## 2019-09-06 ENCOUNTER — Inpatient Hospital Stay (HOSPITAL_COMMUNITY): Payer: Self-pay

## 2019-09-06 ENCOUNTER — Encounter (HOSPITAL_COMMUNITY): Payer: Self-pay | Admitting: Family Medicine

## 2019-09-06 DIAGNOSIS — A419 Sepsis, unspecified organism: Principal | ICD-10-CM

## 2019-09-06 DIAGNOSIS — I4891 Unspecified atrial fibrillation: Secondary | ICD-10-CM

## 2019-09-06 DIAGNOSIS — J189 Pneumonia, unspecified organism: Secondary | ICD-10-CM

## 2019-09-06 DIAGNOSIS — R6520 Severe sepsis without septic shock: Secondary | ICD-10-CM

## 2019-09-06 DIAGNOSIS — J9601 Acute respiratory failure with hypoxia: Secondary | ICD-10-CM

## 2019-09-06 LAB — COMPREHENSIVE METABOLIC PANEL
ALT: 260 U/L — ABNORMAL HIGH (ref 0–44)
AST: 644 U/L — ABNORMAL HIGH (ref 15–41)
Albumin: 2.1 g/dL — ABNORMAL LOW (ref 3.5–5.0)
Alkaline Phosphatase: 115 U/L (ref 38–126)
Anion gap: 15 (ref 5–15)
BUN: 22 mg/dL (ref 8–23)
CO2: 19 mmol/L — ABNORMAL LOW (ref 22–32)
Calcium: 7.6 mg/dL — ABNORMAL LOW (ref 8.9–10.3)
Chloride: 97 mmol/L — ABNORMAL LOW (ref 98–111)
Creatinine, Ser: 0.71 mg/dL (ref 0.61–1.24)
GFR calc Af Amer: >60 mL/min (ref >60)
GFR calc non Af Amer: >60 mL/min (ref >60)
Glucose, Bld: 306 mg/dL — ABNORMAL HIGH (ref 70–99)
Potassium: 3.8 mmol/L (ref 3.5–5.1)
Sodium: 131 mmol/L — ABNORMAL LOW (ref 135–145)
Total Bilirubin: 1.5 mg/dL — ABNORMAL HIGH (ref 0.3–1.2)
Total Protein: 5.8 g/dL — ABNORMAL LOW (ref 6.5–8.1)

## 2019-09-06 LAB — TYPE AND SCREEN
ABO/RH(D): A POS
Antibody Screen: NEGATIVE

## 2019-09-06 LAB — POCT I-STAT 7, (LYTES, BLD GAS, ICA,H+H)
Acid-base deficit: 1 mmol/L (ref 0.0–2.0)
Bicarbonate: 22 mmol/L (ref 20.0–28.0)
Calcium, Ion: 1.09 mmol/L — ABNORMAL LOW (ref 1.15–1.40)
HCT: 31 % — ABNORMAL LOW (ref 39.0–52.0)
Hemoglobin: 10.5 g/dL — ABNORMAL LOW (ref 13.0–17.0)
O2 Saturation: 97 %
Potassium: 3.1 mmol/L — ABNORMAL LOW (ref 3.5–5.1)
Sodium: 133 mmol/L — ABNORMAL LOW (ref 135–145)
TCO2: 23 mmol/L (ref 22–32)
pCO2 arterial: 31.1 mmHg — ABNORMAL LOW (ref 32.0–48.0)
pH, Arterial: 7.458 — ABNORMAL HIGH (ref 7.350–7.450)
pO2, Arterial: 87 mmHg (ref 83.0–108.0)

## 2019-09-06 LAB — CBC WITH DIFFERENTIAL/PLATELET
Abs Immature Granulocytes: 2.01 10*3/uL — ABNORMAL HIGH (ref 0.00–0.07)
Basophils Absolute: 0.1 10*3/uL (ref 0.0–0.1)
Basophils Relative: 1 %
Eosinophils Absolute: 0 10*3/uL (ref 0.0–0.5)
Eosinophils Relative: 0 %
HCT: 32 % — ABNORMAL LOW (ref 39.0–52.0)
Hemoglobin: 10.6 g/dL — ABNORMAL LOW (ref 13.0–17.0)
Immature Granulocytes: 10 %
Lymphocytes Relative: 3 %
Lymphs Abs: 0.5 10*3/uL — ABNORMAL LOW (ref 0.7–4.0)
MCH: 32.8 pg (ref 26.0–34.0)
MCHC: 33.1 g/dL (ref 30.0–36.0)
MCV: 99.1 fL (ref 80.0–100.0)
Monocytes Absolute: 3.7 10*3/uL — ABNORMAL HIGH (ref 0.1–1.0)
Monocytes Relative: 19 %
Neutro Abs: 13 10*3/uL — ABNORMAL HIGH (ref 1.7–7.7)
Neutrophils Relative %: 67 %
Platelets: 41 10*3/uL — ABNORMAL LOW (ref 150–400)
RBC: 3.23 MIL/uL — ABNORMAL LOW (ref 4.22–5.81)
RDW: 14.4 % (ref 11.5–15.5)
WBC: 19.3 10*3/uL — ABNORMAL HIGH (ref 4.0–10.5)
nRBC: 0.2 % (ref 0.0–0.2)

## 2019-09-06 LAB — BASIC METABOLIC PANEL
Anion gap: 11 (ref 5–15)
BUN: 21 mg/dL (ref 8–23)
CO2: 22 mmol/L (ref 22–32)
Calcium: 7.5 mg/dL — ABNORMAL LOW (ref 8.9–10.3)
Chloride: 99 mmol/L (ref 98–111)
Creatinine, Ser: 0.72 mg/dL (ref 0.61–1.24)
GFR calc Af Amer: >60 mL/min (ref >60)
GFR calc non Af Amer: >60 mL/min (ref >60)
Glucose, Bld: 260 mg/dL — ABNORMAL HIGH (ref 70–99)
Potassium: 3.1 mmol/L — ABNORMAL LOW (ref 3.5–5.1)
Sodium: 132 mmol/L — ABNORMAL LOW (ref 135–145)

## 2019-09-06 LAB — MRSA PCR SCREENING: MRSA by PCR: NEGATIVE

## 2019-09-06 LAB — HEMOGLOBIN A1C
Hgb A1c MFr Bld: 9 % — ABNORMAL HIGH (ref 4.8–5.6)
Mean Plasma Glucose: 211.6 mg/dL

## 2019-09-06 LAB — CBG MONITORING, ED
Glucose-Capillary: 348 mg/dL — ABNORMAL HIGH (ref 70–99)
Glucose-Capillary: 282 mg/dL — ABNORMAL HIGH (ref 70–99)
Glucose-Capillary: 260 mg/dL — ABNORMAL HIGH (ref 70–99)

## 2019-09-06 LAB — LACTIC ACID, PLASMA
Lactic Acid, Venous: 3.9 mmol/L (ref 0.5–1.9)
Lactic Acid, Venous: 3.4 mmol/L (ref 0.5–1.9)

## 2019-09-06 LAB — HEPATITIS PANEL, ACUTE
HCV Ab: NONREACTIVE
Hep A IgM: NONREACTIVE
Hep B C IgM: NONREACTIVE
Hepatitis B Surface Ag: NONREACTIVE

## 2019-09-06 LAB — GLUCOSE, CAPILLARY
Glucose-Capillary: 258 mg/dL — ABNORMAL HIGH (ref 70–99)
Glucose-Capillary: 318 mg/dL — ABNORMAL HIGH (ref 70–99)
Glucose-Capillary: 224 mg/dL — ABNORMAL HIGH (ref 70–99)

## 2019-09-06 LAB — HIV ANTIBODY (ROUTINE TESTING W REFLEX): HIV Screen 4th Generation wRfx: NONREACTIVE

## 2019-09-06 LAB — TROPONIN I (HIGH SENSITIVITY)
Troponin I (High Sensitivity): 50 ng/L — ABNORMAL HIGH (ref <18)
Troponin I (High Sensitivity): 48 ng/L — ABNORMAL HIGH (ref <18)

## 2019-09-06 LAB — ABO/RH: ABO/RH(D): A POS

## 2019-09-06 LAB — TSH: TSH: 0.545 u[IU]/mL (ref 0.350–4.500)

## 2019-09-06 LAB — MAGNESIUM
Magnesium: 2.5 mg/dL — ABNORMAL HIGH (ref 1.7–2.4)
Magnesium: 2.3 mg/dL (ref 1.7–2.4)

## 2019-09-06 LAB — PATHOLOGIST SMEAR REVIEW

## 2019-09-06 MED ORDER — VANCOMYCIN HCL IN DEXTROSE 1-5 GM/200ML-% IV SOLN
1000.0000 mg | Freq: Three times a day (TID) | INTRAVENOUS | Status: DC
  Administered 2019-09-06 – 2019-09-07 (×3): 1000 mg via INTRAVENOUS
  Filled 2019-09-06 (×3): qty 200

## 2019-09-06 MED ORDER — AMIODARONE IV BOLUS ONLY 150 MG/100ML
150.0000 mg | Freq: Once | INTRAVENOUS | Status: AC
  Administered 2019-09-06: 150 mg via INTRAVENOUS
  Filled 2019-09-06: qty 100

## 2019-09-06 MED ORDER — ONDANSETRON HCL 4 MG/2ML IJ SOLN: 4.0000 mg | Freq: Four times a day (QID) | INTRAMUSCULAR | Status: DC | PRN

## 2019-09-06 MED ORDER — AMIODARONE HCL IN DEXTROSE 360-4.14 MG/200ML-% IV SOLN
60.0000 mg/h | INTRAVENOUS | Status: DC
  Administered 2019-09-07 – 2019-09-08 (×5): 60 mg/h via INTRAVENOUS
  Filled 2019-09-06 (×4): qty 200

## 2019-09-06 MED ORDER — FUROSEMIDE 10 MG/ML IJ SOLN
40.0000 mg | Freq: Once | INTRAMUSCULAR | Status: AC
  Administered 2019-09-06: 40 mg via INTRAVENOUS
  Filled 2019-09-06: qty 4

## 2019-09-06 MED ORDER — ACETAMINOPHEN 325 MG PO TABS: 650.0000 mg | ORAL_TABLET | Freq: Four times a day (QID) | ORAL | Status: DC | PRN

## 2019-09-06 MED ORDER — ALBUMIN HUMAN 5 % IV SOLN
25.0000 g | Freq: Once | INTRAVENOUS | Status: AC
  Administered 2019-09-06: 25 g via INTRAVENOUS
  Filled 2019-09-06: qty 500

## 2019-09-06 MED ORDER — SODIUM CHLORIDE 0.9% FLUSH
3.0000 mL | Freq: Two times a day (BID) | INTRAVENOUS | Status: DC
  Administered 2019-09-06 – 2019-09-08 (×6): 3 mL via INTRAVENOUS

## 2019-09-06 MED ORDER — SODIUM CHLORIDE 0.9 % IV SOLN: 500.0000 mg | INTRAVENOUS | Status: DC

## 2019-09-06 MED ORDER — PNEUMOCOCCAL VAC POLYVALENT 25 MCG/0.5ML IJ INJ: 0.5000 mL | INJECTION | INTRAMUSCULAR | Status: DC

## 2019-09-06 MED ORDER — DEXTROSE 5 % IV SOLN
10.0000 mg/kg | Freq: Three times a day (TID) | INTRAVENOUS | Status: DC
  Administered 2019-09-06 – 2019-09-07 (×3): 880 mg via INTRAVENOUS
  Filled 2019-09-06 (×5): qty 17.6

## 2019-09-06 MED ORDER — AZITHROMYCIN 250 MG PO TABS: 500.0000 mg | ORAL_TABLET | Freq: Every day | ORAL | Status: DC

## 2019-09-06 MED ORDER — AMIODARONE LOAD VIA INFUSION
150.0000 mg | Freq: Once | INTRAVENOUS | Status: AC
  Administered 2019-09-06: 150 mg via INTRAVENOUS
  Filled 2019-09-06: qty 83.34

## 2019-09-06 MED ORDER — INSULIN GLARGINE 100 UNIT/ML ~~LOC~~ SOLN
10.0000 [IU] | Freq: Every day | SUBCUTANEOUS | Status: DC
  Administered 2019-09-06 – 2019-09-07 (×2): 10 [IU] via SUBCUTANEOUS
  Filled 2019-09-06 (×2): qty 0.1

## 2019-09-06 MED ORDER — SODIUM CHLORIDE 0.9 % IV SOLN
500.0000 mg | INTRAVENOUS | Status: DC
  Administered 2019-09-06 – 2019-09-08 (×3): 500 mg via INTRAVENOUS
  Filled 2019-09-06 (×3): qty 500

## 2019-09-06 MED ORDER — ONDANSETRON HCL 4 MG PO TABS: 4.0000 mg | ORAL_TABLET | Freq: Four times a day (QID) | ORAL | Status: DC | PRN

## 2019-09-06 MED ORDER — AMIODARONE HCL IN DEXTROSE 360-4.14 MG/200ML-% IV SOLN
INTRAVENOUS | Status: AC
  Filled 2019-09-06: qty 200

## 2019-09-06 MED ORDER — SODIUM CHLORIDE 0.9 % IV SOLN
INTRAVENOUS | Status: DC | PRN
  Administered 2019-09-06: 500 mL via INTRAVENOUS

## 2019-09-06 MED ORDER — SODIUM CHLORIDE 0.9 % IV BOLUS
1000.0000 mL | Freq: Once | INTRAVENOUS | Status: AC
  Administered 2019-09-06: 1000 mL via INTRAVENOUS

## 2019-09-06 MED ORDER — CLEVIDIPINE BUTYRATE 0.5 MG/ML IV EMUL
INTRAVENOUS | Status: AC
  Filled 2019-09-06: qty 50

## 2019-09-06 MED ORDER — ORAL CARE MOUTH RINSE
15.0000 mL | Freq: Two times a day (BID) | OROMUCOSAL | Status: DC
  Administered 2019-09-06: 15 mL via OROMUCOSAL

## 2019-09-06 MED ORDER — AMIODARONE HCL IN DEXTROSE 360-4.14 MG/200ML-% IV SOLN
60.0000 mg/h | INTRAVENOUS | Status: DC
  Administered 2019-09-06 (×2): 60 mg/h via INTRAVENOUS
  Filled 2019-09-06 (×2): qty 200

## 2019-09-06 MED ORDER — ACETAMINOPHEN 650 MG RE SUPP: 650.0000 mg | Freq: Four times a day (QID) | RECTAL | Status: DC | PRN

## 2019-09-06 MED ORDER — INFLUENZA VAC SPLIT QUAD 0.5 ML IM SUSY: 0.5000 mL | PREFILLED_SYRINGE | INTRAMUSCULAR | Status: DC

## 2019-09-06 MED ORDER — CHLORHEXIDINE GLUCONATE 0.12 % MT SOLN
15.0000 mL | Freq: Two times a day (BID) | OROMUCOSAL | Status: DC
  Administered 2019-09-06 – 2019-09-07 (×2): 15 mL via OROMUCOSAL

## 2019-09-06 MED ORDER — MORPHINE SULFATE (PF) 2 MG/ML IV SOLN
2.0000 mg | INTRAVENOUS | Status: DC | PRN
  Administered 2019-09-06: 2 mg via INTRAVENOUS
  Filled 2019-09-06: qty 1

## 2019-09-06 MED ORDER — SODIUM CHLORIDE 0.9 % IV SOLN
2.0000 g | INTRAVENOUS | Status: DC
  Administered 2019-09-06: 2 g via INTRAVENOUS
  Filled 2019-09-06: qty 2000
  Filled 2019-09-06: qty 2
  Filled 2019-09-06 (×2): qty 2000

## 2019-09-06 MED ORDER — INSULIN ASPART 100 UNIT/ML ~~LOC~~ SOLN
0.0000 [IU] | SUBCUTANEOUS | Status: DC
  Administered 2019-09-06: 5 [IU] via SUBCUTANEOUS
  Administered 2019-09-06: 7 [IU] via SUBCUTANEOUS
  Administered 2019-09-06: 5 [IU] via SUBCUTANEOUS
  Administered 2019-09-06: 7 [IU] via SUBCUTANEOUS
  Administered 2019-09-06 – 2019-09-07 (×2): 5 [IU] via SUBCUTANEOUS
  Administered 2019-09-07: 3 [IU] via SUBCUTANEOUS
  Administered 2019-09-07: 5 [IU] via SUBCUTANEOUS

## 2019-09-06 MED ORDER — IOHEXOL 350 MG/ML SOLN
75.0000 mL | Freq: Once | INTRAVENOUS | Status: AC | PRN
  Administered 2019-09-06: 75 mL via INTRAVENOUS

## 2019-09-06 MED ORDER — LACTATED RINGERS IV SOLN: INTRAVENOUS | Status: DC

## 2019-09-06 MED ORDER — POTASSIUM CHLORIDE 10 MEQ/100ML IV SOLN
10.0000 meq | INTRAVENOUS | Status: AC
  Administered 2019-09-06 – 2019-09-07 (×6): 10 meq via INTRAVENOUS
  Filled 2019-09-06 (×6): qty 100

## 2019-09-06 MED ORDER — CHLORHEXIDINE GLUCONATE CLOTH 2 % EX PADS
6.0000 | MEDICATED_PAD | Freq: Every day | CUTANEOUS | Status: DC
  Administered 2019-09-06 – 2019-09-07 (×2): 6 via TOPICAL

## 2019-09-06 MED ORDER — SODIUM CHLORIDE 0.9 % IV SOLN: INTRAVENOUS | Status: AC

## 2019-09-06 NOTE — CV Procedure (Signed)
Echo attempted but HR too high. Will attempt again when HR normalizes.

## 2019-09-06 NOTE — Progress Notes (Signed)
NAME:  Derek [name], MRN:  [id number], DOB:  1956-03-31, LOS: 1 ADMISSION DATE:  2019/09/19, CONSULTATION DATE: 3/25 REFERRING MD: Dr. Antionette Char, CHIEF COMPLAINT: Sepsis  Brief History   64 year old male admitted 3/24 with severe sepsis initially felt possibly 2/2 CNS but developed rapid progressive respiratory failure and CT of chest raising concern for multifocal PNA  Past Medical History   has a past medical history of Diabetes mellitus (HCC), Tobacco abuse, and Traumatic pneumothorax (2015).  Significant Hospital Events   3/25 admit-->started on CNS coverage for neck stiffness and infection. Pt refusing LP but as time went on had persistent AF w/ RVR and progressive respiratory failure. CT imaging of chest later showed multifocal airspace disease w/ consolidative changes and bilateral effusions. PCCM asked to re-eval. On arrival he was on 100% NRB w/ pulse ox in 80s-->placed on BIPAP-->changed order to ICU   Consults:    Procedures:   Significant Diagnostic Tests:  CT head 3/25 > nonacute  Micro Data:  COVID FLU PCR neg Blood 3/25 > HIV 3/25: neg Resp panel 3/25: neg for influenza and covid  Antimicrobials:  Ceftriaxone 3/24 > Vancomycin 3/24 > Acyclovir 3/25>>> azith 3/25>>>  Interim history/subjective:  Escalating oxygen needs   Objective   Blood pressure 108/78, pulse (Abnormal) 136, temperature 99.5 F (37.5 C), temperature source Oral, resp. rate (Abnormal) 37, height 6\' 1"  (1.854 m), weight 99.8 kg, SpO2 92 %.        Intake/Output Summary (Last 24 hours) at 09/06/2019 1352 Last data filed at 09/06/2019 0154 Gross per 24 hour  Intake 4600 ml  Output no documentation  Net 4600 ml   Filed Weights   2019/09/19 1807  Weight: 99.8 kg    Examination: General anxious 64 year old white male. Now on 100% NRB + accessory use HENT NCAT no JVD MMM pulm + accessory use bilateral rhonchi.  Card tachy irreg abd not tender  Ext warm and dry  Neuro intact but anxious    Resolved Hospital Problem list     Assessment & Plan:   Severe sepsis etiology initially felt possibly CNS but w/ further questioning seems less likely. Pain was more myalgia than nuchal rigidity. More inclined to thing PNA at this point.  Plan Cont IVFs Repeat lactic acid now.  F/u pending cultures; if intubated will get sputum culture  Cont empiric abx-->cont vanc and CTX; add azith Ito cover atypical's) ECHO  Acute hypoxic respiratory failure in setting of what looks like multifocal PNA;  complicated by element pulmonary edema and effusions CT chest personally reviewed. No PE. Patchy bilateral GG changes but also has dense consolidations R >L lower lobe Progressive hypoxia in spite of escalated oxygen  Plan NPO Try BIPAP Pulse ox Low threshold for intubation  Progressive metabolic/lactic acidosis in setting of sepsis Plan Ensure MAP > 65 Decreased WOB Cont supportive care Serial lactic acids  Atrial fibrillation with RVR: no doubt exacerbated by sepsis.  Plan Rate control  Holding ac for now Trying to better support respiratory status-->treating sepsis and respiratory failure are the key interventions here   Abnormal LFTs-->suspect shock liver Plan Serial LFTs Supportive care  Leukocytosis in setting of sepsis Plan Trend cbc  DM w/ hyperglycemia : was started on metformin years back but never took it due to worries of side effects. He now has tingling in his feet.  - check A1c Plan ssi    Best practice  Glycemic control: SSI and lantus Diet: NPO as of 3/25 VTE: [place] heparin  PUD: H2B Sedation protocol: not ordered on admit  Activity: bedrest Code status: full code  dispo-->to ICU   cct 40 minutes.   Erick Colace ACNP-BC [place] Pager # [phone] OR # [phone] if no answer

## 2019-09-06 NOTE — Consult Note (Addendum)
NAME:  Derek [name], MRN:  [id number], DOB:  1955/12/28, LOS: 1 ADMISSION DATE:  2019/09/09, CONSULTATION DATE: 3/25 REFERRING MD: Dr. Antionette Char, CHIEF COMPLAINT: Sepsis  Brief History   64 year old male admitted with severe sepsis with CNS infection high on the differential.  History of present illness   64 year old male with past medical history as below, which is significant for diabetes mellitus.  He has not been taking Metformin because he did not like it. He presented to [facility] emergency department in the evening hours of 3/24 with complaints of fever and cough.  Upon further work-up he has not been feeling well for about a week and has come planed of head and neck soreness as well as pain across his back, arms, and legs.  Also complains of sore throat.  Covid and flu testing were negative in the ED.  He was started on empiric antibiotics.  There was obviously concern for meningitis considering his head and neck pain, however, the patient would not allow the ED physician to perform a lumbar puncture.  He was notably also in SVT upon arrival treated initially with IV fluids and adenosine.  Underlying rhythm was unclear with felt to be represent atrial fibrillation he was started on diltiazem.  He was admitted to the hospital service for severe sepsis.  Past Medical History   has no past medical history on file.  Significant Hospital Events   3/25 admit  Consults:    Procedures:   Significant Diagnostic Tests:  CT head 3/25 > nonacute  Micro Data:  COVID FLU PCR neg Blood 3/25 >  Antimicrobials:  Ceftriaxone 3/24 > Vancomycin 3/24 > Ampicillin 3/24 >  Interim history/subjective:    Objective   Blood pressure 104/85, pulse (!) 151, temperature 99.5 F (37.5 C), temperature source Oral, resp. rate (!) 23, height 6\' 1"  (1.854 m), weight 99.8 kg, SpO2 94 %.        Intake/Output Summary (Last 24 hours) at 09/06/2019 0100 Last data filed at 09-Sep-2019 2331 Gross per 24 hour    Intake 4400 ml  Output --  Net 4400 ml   Filed Weights   09-Sep-2019 1807  Weight: 99.8 kg    Examination: General: adult male of normal body habitus HENT: [place]/AT, PERRL, no JVD Lungs: Clear, unlabored Cardiovascular: Tachy, IRIR, no MRG Abdomen: Soft, non-tender Extremities: So acute deformity. Scabbed rash on bilateral lower extremity  Neuro: Alert, oriented, non-focal  Resolved Hospital Problem list     Assessment & Plan:   Severe sepsis: etiology unclear. With headache and nuchal rigidity of course meningitis is high in the differential. He is refusing lumbar puncture. Is hemodynamically stable at this time.  - Agree with antibiotics as ordered - Lumbar puncture should he consent  - Ensure lactic clearing - Ongoing IVF resuscitation - Admit to stepdown unit under hospitalist care.   Atrial fibrillation with RVR: no doubt exacerbated by sepsis and dehydration - IVF resuscitation - Diltiazem infusion - Anticoagulation per primary  DM: was started on metformin years back but never took it due to worries of side effects. He now has tingling in his feet.  - check A1c - Will need treatment plan at dispo  Hypokalemia: - Per primary  Labs   CBC: Recent Labs  Lab 09-Sep-2019 1616 Sep 09, 2019 1628 Sep 09, 2019 2209  WBC 23.9*  --  18.2*  NEUTROABS 15.3*  --  12.0*  HGB 12.7* 12.9* 11.2*  HCT 37.7* 38.0* 34.1*  MCV 95.9  --  98.8  PLT  PLATELET CLUMPS NOTED ON SMEAR, COUNT APPEARS DECREASED  --  53*    Basic Metabolic Panel: Recent Labs  Lab 09/04/2019 1616 09/02/2019 1628 09/09/2019 2209  NA 128* 130*  --   K 3.1* 2.9*  --   CL 87*  --   --   CO2 22  --   --   GLUCOSE 355*  --   --   BUN 24*  --   --   CREATININE 0.92  --   --   CALCIUM 8.5*  --   --   MG  --   --  2.3   GFR: Estimated Creatinine Clearance: 100.9 mL/min (by C-G formula based on SCr of 0.92 mg/dL). Recent Labs  Lab 09/04/2019 1608 08/30/2019 1616 08/25/2019 1900 09/07/2019 2209  PROCALCITON  --  1.32   --   --   WBC  --  23.9*  --  18.2*  LATICACIDVEN 4.2*  --  3.5* 4.3*    Liver Function Tests: Recent Labs  Lab 08/26/2019 1616  AST 20  ALT 16  ALKPHOS 116  BILITOT 1.0  PROT 7.2  ALBUMIN 2.4*   No results for input(s): LIPASE, AMYLASE in the last 168 hours. No results for input(s): AMMONIA in the last 168 hours.  ABG    Component Value Date/Time   HCO3 23.3 08/31/2019 1628   TCO2 24 09/04/2019 1628   O2SAT 81.0 08/26/2019 1628     Coagulation Profile: Recent Labs  Lab 08/30/2019 1847  INR 1.3*    Cardiac Enzymes: No results for input(s): CKTOTAL, CKMB, CKMBINDEX, TROPONINI in the last 168 hours.  HbA1C: Hgb A1c MFr Bld  Date/Time Value Ref Range Status  08/30/2013 03:04 AM 7.9 (H) <5.7 % Final    Comment:    (NOTE)                                                                       According to the ADA Clinical Practice Recommendations for 2011, when HbA1c is used as a screening test:  >=6.5%   Diagnostic of Diabetes Mellitus           (if abnormal result is confirmed) 5.7-6.4%   Increased risk of developing Diabetes Mellitus References:Diagnosis and Classification of Diabetes Mellitus,Diabetes YQMV,7846,96(EXBMW 1):S62-S69 and Standards of Medical Care in         Diabetes - 2011,Diabetes UXLK,4401,02 (Suppl 1):S11-S61.    CBG: No results for input(s): GLUCAP in the last 168 hours.  Review of Systems:   Bolds are positive  Constitutional: weight loss, gain, night sweats, Fevers, chills, fatigue .  HEENT: headaches, Sore throat, sneezing, nasal congestion, post nasal drip, Difficulty swallowing, Tooth/dental problems, visual complaints visual changes, ear ache CV:  chest pain worse with movement, radiates:,Orthopnea, PND, swelling in lower extremities, dizziness, palpitations, syncope.  GI  heartburn, indigestion, abdominal pain, nausea, vomiting, diarrhea, change in bowel habits, loss of appetite, bloody stools.  Resp: cough, productive:, hemoptysis,  dyspnea, chest pain, pleuritic.  Skin: rash or itching or icterus GU: dysuria, change in color of urine, urgency or frequency. flank pain, hematuria  MS: joint pain: various joints, intermittent.  or swelling. decreased range of motion  Psych: change in mood or affect. depression or anxiety.  Neuro: difficulty  with speech, weakness, numbness, ataxia    Past Medical History  He,  has no past medical history on file.   Surgical History   History reviewed. No pertinent surgical history.   Social History   reports that he has been smoking cigarettes. He has a 49.00 pack-year smoking history. He has never used smokeless tobacco. He reports that he does not drink alcohol or use drugs.   Family History   His family history is not on file.   Allergies No Known Allergies   Home Medications  Prior to Admission medications   Medication Sig Start Date End Date Taking? Authorizing Provider  Aspirin-Caffeine (BAYER BACK & BODY PAIN EX ST) 500-32.5 MG TABS Take 2 tablets by mouth every 5 (five) hours as needed (for pain).    Yes [provider]  metFORMIN (GLUCOPHAGE) 500 MG tablet Take 1 tablet (500 mg total) by mouth 2 (two) times daily with a meal. Patient not taking: Reported on 09-12-19 09/01/13   Riebock, Anette Riedel, NP  oxyCODONE 10 MG TABS Take 1-2 tablets (10-20 mg total) by mouth every 4 (four) hours as needed (10mg  for mild pain, 15mg  for moderate pain, 20mg  for severe pain). Patient not taking: Reported on 12-Sep-2019 09/01/13   , NP     09/07/2019, AGACNP-BC [facility]  See Amion for personal pager PCCM on call pager [phone]  09/06/2019 2:01 AM Patient seen examined and chart reviewed with Mr Joneen Roach.  Agree with above assessment and plan.

## 2019-09-06 NOTE — Progress Notes (Addendum)
PROGRESS NOTE    Kemond Amorin  UPJ:[id number] DOB: [birth year] DOA: 09-16-2019 PCP: Patient, No Pcp Per   Brief Narrative:  Patient is a 64 year old male with history of type 2 diabetes mellitus currently not on any medications who presented to the emergency department with complaints of nausea, shortness of breath, cough, neck pain, headache, fever, chills for 1 week.  On presentation, he was afebrile, saturating in low 90s on room air, tachypneic, tachycardic.  EKG showed SVT-like picture.  Chest x-ray did not show pneumonia.  Urinalysis showed glucosuria, ketonuria, proteinuria.  Head CT was negative for acute intracranial abnormalities.  He had severe leukocytosis, hypokalemia, lactic she was 4.2 on presentation.  CNS infection was suspected but he refused lumbar puncture.  Blood culture sent. He was also given adenosine earlier but later EKG showed A. fib and was started on Cardizem bolus and infusion.  Started on broad-spectrum antibiotics. PCCM, cardiology, neurology consulted.  Assessment & Plan:   Principal Problem:   Sepsis (HCC) Active Problems:   DM (diabetes mellitus) (HCC)   Atrial fibrillation with RVR (HCC)   Hypokalemia   Hyponatremia   Thrombocytopenia (HCC)   Severe sepsis: Presented with 1 day history of fever, cough, generalized aches, neck pain, headache.  Lactic acid level elevated.  Has leukocytosis.  Covid and flu negative.  UA not impressive for urinary tract infection.  Abdominal exam was benign.  Continue current antibiotics.  Follow-up cultures.  Headache/neck pain: Complains of severe headache.  Denies LP.  CNS infection is in differential.  Started on vancomycin, acyclovir and  Rocephin.   Neurology consulted.  CT head did not show any acute intracranial abnormalities.  Will order MRI with and without contrast, CTA head and neck  Acute hypoxic respiratory failure: Currently on nonrebreather .PCCM also consulted.  Chest x-ray did not show pneumonia.  Will  check CT angio to rule out PE.Elevated d dimer I have requested PCCM to reevaluate him this afternoon.  He might need ICU care.  A. fib with RVR: New onset.  Currently on Cardizem drip.  No history of A. fib.  Follow-up echocardiogram.  TSH is normal.  Cardiology consulted.  A. fib could be associated with acute medical illness. CHADSVASC is 1.  Not started on anticoagulation.  Elevated liver enzymes: Cud be associated with sepsis.Will check hepatitis panel.  Diabetes type 2: Diagnosed with diabetes years ago but not taking any medications. Previously on metformin. Hyperglycemic in presentation.  Started on sliding scale insulin.hbA1c of 9.0  Thrombocytopenia: Platelets of 53,000.  Could be associated  with sepsis.  Continue to monitor CBC  Hypokalemia: Supplemented with potassium  Tobacco abuse: Continues to smoke.           DVT prophylaxis: SCD Code Status: Full Family Communication: Called daughter on phone for update on 09/06/19 Disposition Plan: Patient is from home.  Not stable for discharge, pending work-up.  Anticipate discharge back to home when stable   Consultants: Cardiology, neurology, PCCM  Procedures: None  Antimicrobials:  Anti-infectives (From admission, onward)   Start     Dose/Rate Route Frequency Ordered Stop   09/06/19 0800  cefTRIAXone (ROCEPHIN) 2 g in sodium chloride 0.9 % 100 mL IVPB     2 g 200 mL/hr over 30 Minutes Intravenous Every 12 hours 09/16/2019 1913     09/06/19 0800  vancomycin (VANCOREADY) IVPB 1500 mg/300 mL     1,500 mg 150 mL/hr over 120 Minutes Intravenous Every 12 hours 09/16/2019 1915     09/06/19 0800  ampicillin (OMNIPEN) 2 g in sodium chloride 0.9 % 100 mL IVPB     2 g 300 mL/hr over 20 Minutes Intravenous Every 4 hours 09/06/19 0730     08/14/2019 1915  ampicillin (OMNIPEN) 2 g in sodium chloride 0.9 % 100 mL IVPB  Status:  Discontinued     2 g 300 mL/hr over 20 Minutes Intravenous Every 4 hours 09/09/2019 1913 09/06/19 0730    08/27/2019 1915  vancomycin (VANCOREADY) IVPB 1500 mg/300 mL  Status:  Discontinued     1,500 mg 150 mL/hr over 120 Minutes Intravenous Every 12 hours 08/24/2019 1913 09/12/2019 1915   08/20/2019 1900  cefTRIAXone (ROCEPHIN) 2 g in sodium chloride 0.9 % 100 mL IVPB     2 g 200 mL/hr over 30 Minutes Intravenous  Once 08/27/2019 1847 09/11/2019 2045   08/16/2019 1900  vancomycin (VANCOCIN) IVPB 1000 mg/200 mL premix  Status:  Discontinued     1,000 mg 200 mL/hr over 60 Minutes Intravenous  Once 08/18/2019 1847 08/21/2019 1854   08/23/2019 1900  vancomycin (VANCOCIN) 2,500 mg in sodium chloride 0.9 % 500 mL IVPB     2,500 mg 250 mL/hr over 120 Minutes Intravenous  Once 08/19/2019 1857 08/14/2019 2331      Subjective: Patient seen at the bedside.  He was in moderate respiratory distress and was on nonrebreather.  Complains of severe headache and neck pain.  After evaluating my evaluation.  He was in A. fib with RVR.  He was very upset when I requested for consideration of LP and denied examination.  Objective: Vitals:   09/06/19 0645 09/06/19 0700 09/06/19 0715 09/06/19 0815  BP: 101/78 129/88 108/87 115/89  Pulse: (!) 146     Resp: (!) 23 (!) 39 (!) 37 (!) 34  Temp:      TempSrc:      SpO2: 91% 94%    Weight:      Height:        Intake/Output Summary (Last 24 hours) at 09/06/2019 0847 Last data filed at 09/06/2019 0154 Gross per 24 hour  Intake 4600 ml  Output --  Net 4600 ml   Filed Weights   08/21/2019 1807  Weight: 99.8 kg    Examination:  General exam: In moderate respiratory distress, A. fib with RVR Denied examination   Data Reviewed: I have personally reviewed following labs and imaging studies  CBC: Recent Labs  Lab 09/04/2019 1616 09/11/2019 1628 08/19/2019 2209 09/06/19 0429  WBC 23.9*  --  18.2* 19.3*  NEUTROABS 15.3*  --  12.0* 13.0*  HGB 12.7* 12.9* 11.2* 10.6*  HCT 37.7* 38.0* 34.1* 32.0*  MCV 95.9  --  98.8 99.1  PLT PLATELET CLUMPS NOTED ON SMEAR, COUNT APPEARS DECREASED  --   53* 41*   Basic Metabolic Panel: Recent Labs  Lab 09/10/2019 1616 09/03/2019 1628 08/16/2019 2209 09/06/19 0429  NA 128* 130*  --  131*  K 3.1* 2.9*  --  3.8  CL 87*  --   --  97*  CO2 22  --   --  19*  GLUCOSE 355*  --   --  306*  BUN 24*  --   --  22  CREATININE 0.92  --   --  0.71  CALCIUM 8.5*  --   --  7.6*  MG  --   --  2.3 2.5*   GFR: Estimated Creatinine Clearance: 116 mL/min (by C-G formula based on SCr of 0.71 mg/dL). Liver Function Tests: Recent Labs  Lab 09/11/2019 1616 09/06/19 0429  AST 20 644*  ALT 16 260*  ALKPHOS 116 115  BILITOT 1.0 1.5*  PROT 7.2 5.8*  ALBUMIN 2.4* 2.1*   No results for input(s): LIPASE, AMYLASE in the last 168 hours. No results for input(s): AMMONIA in the last 168 hours. Coagulation Profile: Recent Labs  Lab 08/18/2019 1847  INR 1.3*   Cardiac Enzymes: No results for input(s): CKTOTAL, CKMB, CKMBINDEX, TROPONINI in the last 168 hours. BNP (last 3 results) No results for input(s): PROBNP in the last 8760 hours. HbA1C: Recent Labs    09/06/19 0429  HGBA1C 9.0*   CBG: Recent Labs  Lab 09/06/19 0345 09/06/19 0821  GLUCAP 348* 282*   Lipid Profile: Recent Labs    09/01/2019 1616  TRIG 77   Thyroid Function Tests: Recent Labs    09/06/19 0429  TSH 0.545   Anemia Panel: No results for input(s): VITAMINB12, FOLATE, FERRITIN, TIBC, IRON, RETICCTPCT in the last 72 hours. Sepsis Labs: Recent Labs  Lab 08/22/2019 1608 08/21/2019 1616 08/31/2019 1900 08/26/2019 2209 09/06/19 0018  PROCALCITON  --  1.32  --   --   --   LATICACIDVEN 4.2*  --  3.5* 4.3* 3.9*    Recent Results (from the past 240 hour(s))  Blood Culture (routine x 2)     Status: None (Preliminary result)   Collection Time: 08/30/2019  4:03 PM   Specimen: BLOOD  Result Value Ref Range Status   Specimen Description BLOOD LEFT ANTECUBITAL  Final   Special Requests   Final    BOTTLES DRAWN AEROBIC AND ANAEROBIC Blood Culture results may not be optimal due to an  excessive volume of blood received in culture bottles   Culture   Final    NO GROWTH < 24 HOURS Performed at [facility], [place] [address], [place], [place] [address]    Report Status PENDING  Incomplete  Blood Culture (routine x 2)     Status: None (Preliminary result)   Collection Time: 08/28/2019  4:56 PM   Specimen: BLOOD RIGHT FOREARM  Result Value Ref Range Status   Specimen Description BLOOD RIGHT FOREARM  Final   Special Requests   Final    BOTTLES DRAWN AEROBIC AND ANAEROBIC Blood Culture results may not be optimal due to an excessive volume of blood received in culture bottles   Culture   Final    NO GROWTH < 24 HOURS Performed at [facility], [place] [address], [place], [place] [address]    Report Status PENDING  Incomplete  Respiratory Panel by RT PCR (Flu A&B, Covid) - Nasopharyngeal Swab     Status: None   Collection Time: 09/10/2019  5:40 PM   Specimen: Nasopharyngeal Swab  Result Value Ref Range Status   SARS Coronavirus 2 by RT PCR NEGATIVE NEGATIVE Final    Comment: (NOTE) SARS-CoV-2 target nucleic acids are NOT DETECTED. The SARS-CoV-2 RNA is generally detectable in upper respiratoy specimens during the acute phase of infection. The lowest concentration of SARS-CoV-2 viral copies this assay can detect is 131 copies/mL. A negative result does not preclude SARS-Cov-2 infection and should not be used as the sole basis for treatment or other patient management decisions. A negative result may occur with  improper specimen collection/handling, submission of specimen other than nasopharyngeal swab, presence of viral mutation(s) within the areas targeted by this assay, and inadequate number of viral copies (<131 copies/mL). A negative result must be combined with clinical observations, patient history,  and epidemiological information. The expected result is Negative. Fact Sheet for Patients:  https://www.moore.com/ Fact Sheet for Healthcare  Providers:  https://www.young.biz/ This test is not yet ap proved or cleared by the [place] FDA and  has been authorized for detection and/or diagnosis of SARS-CoV-2 by FDA under an Emergency Use Authorization (EUA). This EUA will remain  in effect (meaning this test can be used) for the duration of the COVID-19 declaration under Section 564(b)(1) of the Act, 21 U.S.C. section 360bbb-3(b)(1), unless the authorization is terminated or revoked sooner.    Influenza A by PCR NEGATIVE NEGATIVE Final   Influenza B by PCR NEGATIVE NEGATIVE Final    Comment: (NOTE) The Xpert Xpress SARS-CoV-2/FLU/RSV assay is intended as an aid in  the diagnosis of influenza from Nasopharyngeal swab specimens and  should not be used as a sole basis for treatment. Nasal washings and  aspirates are unacceptable for Xpert Xpress SARS-CoV-2/FLU/RSV  testing. Fact Sheet for Patients: https://www.moore.com/ Fact Sheet for Healthcare Providers: https://www.young.biz/ This test is not yet approved or cleared by the [place] FDA and  has been authorized for detection and/or diagnosis of SARS-CoV-2 by  FDA under an Emergency Use Authorization (EUA). This EUA will remain  in effect (meaning this test can be used) for the duration of the  Covid-19 declaration under Section 564(b)(1) of the Act, 21  U.S.C. section 360bbb-3(b)(1), unless the authorization is  terminated or revoked. Performed at [facility], [address] [place], [place] [address]          Radiology Studies: CT Head Wo Contrast  Result Date: September 25, 2019 CLINICAL DATA:  Headache EXAM: CT HEAD WITHOUT CONTRAST TECHNIQUE: Contiguous axial images were obtained from the base of the skull through the vertex without intravenous contrast. COMPARISON:  None. FINDINGS: Brain: Mild age related volume loss. No acute intracranial abnormality. Specifically, no hemorrhage, hydrocephalus,  mass lesion, acute infarction, or significant intracranial injury. Vascular: No hyperdense vessel or unexpected calcification. Skull: No acute calvarial abnormality. Sinuses/Orbits: Visualized paranasal sinuses and mastoids clear. Orbital soft tissues unremarkable. Other: None IMPRESSION: No acute intracranial abnormality. Electronically Signed   By: Charlett Nose M.D.   On: 09-25-19 19:54   DG Chest Port 1 View  Result Date: Sep 25, 2019 CLINICAL DATA:  Cough, fever EXAM: PORTABLE CHEST 1 VIEW COMPARISON:  09/01/2013 FINDINGS: Single frontal view of the chest demonstrates a stable cardiac silhouette. There are multiple prior healed left rib fractures. Linear consolidation at the left lung base likely reflects scarring. No airspace disease, effusion, or pneumothorax. IMPRESSION: 1. No acute intrathoracic process. Electronically Signed   By: Sharlet Salina M.D.   On: 09-25-2019 16:38        Scheduled Meds: . insulin aspart  0-9 Units Subcutaneous Q4H  . sodium chloride flush  3 mL Intravenous Q12H   Continuous Infusions: . sodium chloride 125 mL/hr at 09/06/19 0215  . ampicillin (OMNIPEN) IV 2 g (09/06/19 0817)  . cefTRIAXone (ROCEPHIN)  IV    . diltiazem (CARDIZEM) infusion 15 mg/hr (09/06/19 0312)  . famotidine (PEPCID) IV Stopped (09/06/19 0154)  . vancomycin       LOS: 1 day    Time spent: 35 mins,More than 50% of that time was spent in counseling and/or coordination of care.      Burnadette Pop, MD [facility] P3/25/2021, 8:47 AM

## 2019-09-06 NOTE — ED Notes (Addendum)
Pt SpO2 decreased to 83% on 2L [place]. Increased to 4L, SpO2 increased to 89%. NRB applied at 15L, increased to 93%. MD paged.

## 2019-09-06 NOTE — ED Notes (Signed)
Pt transported to CT ?

## 2019-09-06 NOTE — Plan of Care (Signed)
Pt tolerating BiPap well, resting comfortably.

## 2019-09-06 NOTE — ED Notes (Signed)
[place]. Renford [place] [phone], RN paged by Marylene Land

## 2019-09-06 NOTE — ED Notes (Signed)
Contacted RT for bipap, per CCM

## 2019-09-06 NOTE — Progress Notes (Signed)
eLink Physician-Brief Progress Note Patient Name: Fumio Vandam DOB: 10-Dec-1955 MRN: [id number]   Date of Service  09/06/2019  HPI/Events of Note  Hypokalemia - K+ = 3.1 and Creatinine = 0.72.   eICU Interventions  Will replace K+.      Intervention Category Major Interventions: Electrolyte abnormality - evaluation and management  Jomari Bartnik Dennard Nip 09/06/2019, 10:31 PM

## 2019-09-06 NOTE — Progress Notes (Addendum)
Pharmacy Antibiotic Note  Derek [name] is a 64 y.o. male admitted on 09/12/2019 with meningitis. Pharmacy has been consulted for vancomycin dosing.  Of note, WBC remains elevated. PCT 1.32. LA 4.3>3.9. SCr wnl   Plan: Will transition patient to traditional dosing with vancomycin 1 gm IV Q 8 hours  Monitor CBC, renal fx, and cultures   Height: 6\' 1"  (185.4 cm) Weight: 220 lb (99.8 kg) IBW/kg (Calculated) : 79.9  Temp (24hrs), Avg:99.5 F (37.5 C), Min:99.5 F (37.5 C), Max:99.5 F (37.5 C)  Recent Labs  Lab 09/01/2019 1608 08/30/2019 1616 08/19/2019 1900 08/24/2019 2209 09/06/19 0018 09/06/19 0429  WBC  --  23.9*  --  18.2*  --  19.3*  CREATININE  --  0.92  --   --   --  0.71  LATICACIDVEN 4.2*  --  3.5* 4.3* 3.9*  --     Estimated Creatinine Clearance: 116 mL/min (by C-G formula based on SCr of 0.71 mg/dL).    No Known Allergies  Antimicrobials this admission: 3/24 vanc >>  3/24 ceftriaxone >>  3/24 ampicillin >>  Dose adjustments this admission: N/A  Microbiology results: 3/24 BCx: ngtd 3/24 UCx: sent   Thank you for allowing pharmacy to be a part of this patient's care.  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 Will start acyclovir 10 mg/kg q 8 hours.   [facility], PharmD., BCPS Clinical Pharmacist

## 2019-09-06 NOTE — Progress Notes (Signed)
eLink Physician-Brief Progress Note Patient Name: Derek [name] DOB: 1956/01/17 MRN: [id number]   Date of Service  09/06/2019  HPI/Events of Note  Multiple issues: 1. AFIB with RVR - Ventricular rate = 120's to 140's. BP = 108/62. Currently on a Cardizem IV infusion at 15 mg/hour and 2. Lactic Acid = 3.4.   eICU Interventions  Will order: 1. Amiodarone IV load and infusion. 2. BMP and Mg++ level STAT. 3. Wean Cardizem IV infusion off as tolerated.  4. Bolus with 0.9 NaCl 1 liter IV over 1 hour now.      Intervention Category Major Interventions: Arrhythmia - evaluation and management  Rosaleen Mazer Dennard Nip 09/06/2019, 8:26 PM

## 2019-09-06 NOTE — Consult Note (Addendum)
Cardiology Consultation:   Patient ID: Derek [name] MRN: [id number]; DOB: [birth year]  Admit date: 08/26/2019 Date of Consult: 09/06/2019  Primary Care Provider: Patient, No Pcp Per Primary Cardiologist: New to Jodelle Red, MD Primary Electrophysiologist:  None   Patient Profile:   Derek [name] is a 64 y.o. male with a hx of DM, tobacco abuse, traumatic pneumothorax after fall 2015 who is being seen today for the evaluation of atrial fibrillation at the request of Dr. Renford Dills.   History of Present Illness:   He has no prior cardiac history. He was diagnosed with diabetes in 2015 during an admission for pneumothorax after a fall 4 feet onto concrete. Documentation is notable for some tumultuous interactions during attempts at educating and offering help to the patient. He has not followed up with primary care regularly since that time. He does not like being on medication so has not been taking anything.   He presented with complaints of malaise, body aches and fever that started about a week ago. It started with a headache and even scalp pain. He states "even my hair hurts." He then had diffuse body aches and soreness across the sides of his neck, worse with position changes of the neck. He also developed pain across his back, arms, and legs, slight cough, sore throat, rhinorrhea, SOB, nausea and vomiting. He thought he had Covid. Due to feeling so poorly he sought care in the ER. Here, temp was 99.5, borderline O2 sats, tachypneic in the low 30s, tachycardic in the 170s and hypotensive in the 90s. He was treated with adenosine, saline, and antibiotics with slowing of HR to 150s which looked more consistent with atrial fib rather than SVT. He was given diltiazem bolus and started on infusion. Telemetry since that time shows paroxysmal atrial fib with RVR (110-150s), with brief short-lived conversions to NSR for only a few beats. He was admitted with sepsis and concern for meningitis but  the patient has refused lumbar puncture for unclear reasons.   His labs revealed leukocytosis with WBC 24k, glucose of 335 with glycosuria, hypokalemia of 2.9, hyponatremia of 128 (correcting to 133 for hyperglycemia), thrombocytopenia down to 41k, lactic acidosis, anemia with Hgb down to 10.6, hypoalbuminemia of 2.1, procalcitonin 1.32. LFTs were initially normal but have risen to AST 644 and ALT 260. D-dimer 3.12. He has since required additional escalation to NRB for hypoxia on [place]. Covid/flu testing was negative. CXR and CT head nonacute. CT angio to exclude PE is pending. He is on vancomycin, ceftriaxone, and acyclovir. He denies any overt chest pain but states his whole body feels inflamed and he has discomfort everywhere when he makes any position changes. He states he felt better for a few hours in the middle of the night after one of the antibiotics but this was short lived. He continues to feel diffuse body aches, neck ache with position changes and shortness of breath.  He became acutely upset/agitated when I stated that I understood the ER had wanted to do a lumbar puncture. He states everyone needs to leave him alone, there's too many people coming into the room, all he needs is the antibiotic, and we need to stop trying to con him into treatment. He is threatening to leave and go seek care at [facility] or [facility]. I tried to reassure him that were are there to help but he then refused to talk to me any further.   Past Medical History:  Diagnosis Date  . Diabetes mellitus (HCC)   .  Tobacco abuse   . Traumatic pneumothorax 2015    Past Surgical History:  Procedure Laterality Date  . CHEST TUBE INSERTION       Home Medications:  Prior to Admission medications   Medication Sig Start Date End Date Taking? Authorizing Provider  Aspirin-Caffeine (BAYER BACK & BODY PAIN EX ST) 500-32.5 MG TABS Take 2 tablets by mouth every 5 (five) hours as needed (for pain).    Yes [provider]  metFORMIN (GLUCOPHAGE) 500 MG tablet Take 1 tablet (500 mg total) by mouth 2 (two) times daily with a meal. Patient not taking: Reported on September 29, 2019 09/01/13   [place], Estill Bakes, NP  oxyCODONE 10 MG TABS Take 1-2 tablets (10-20 mg total) by mouth every 4 (four) hours as needed (10mg  for mild pain, 15mg  for moderate pain, 20mg  for severe pain). Patient not taking: Reported on 09-29-2019 09/01/13   Erby Pian, NP    Inpatient Medications: Scheduled Meds: . insulin aspart  0-9 Units Subcutaneous Q4H  . insulin glargine  10 Units Subcutaneous Daily  . sodium chloride flush  3 mL Intravenous Q12H   Continuous Infusions: . sodium chloride 125 mL/hr at 09/06/19 1047  . acyclovir    . cefTRIAXone (ROCEPHIN)  IV Stopped (09/06/19 9563)  . diltiazem (CARDIZEM) infusion 15 mg/hr (09/06/19 0312)  . famotidine (PEPCID) IV 20 mg (09/06/19 1109)  . vancomycin 1,000 mg (09/06/19 1041)   PRN Meds: acetaminophen **OR** acetaminophen, ondansetron **OR** ondansetron (ZOFRAN) IV  Allergies:   No Known Allergies  Social History:   Social History   Socioeconomic History  . Marital status: Single    Spouse name: Not on file  . Number of children: Not on file  . Years of education: Not on file  . Highest education level: Not on file  Occupational History  . Not on file  Tobacco Use  . Smoking status: Current Every Day Smoker    Packs/day: 1.00    Years: 55.00    Pack years: 55.00    Types: Cigarettes  . Smokeless tobacco: Never Used  . Tobacco comment: Smoked since age 43  Substance and Sexual Activity  . Alcohol use: No  . Drug use: No  . Sexual activity: Not on file  Other Topics Concern  . Not on file  Social History Narrative  . Not on file   Social Determinants of Health   Financial Resource Strain:   . Difficulty of Paying Living Expenses:   Food Insecurity:   . Worried About Charity fundraiser in the Last Year:   . Arboriculturist in the Last Year:     Transportation Needs:   . Film/video editor (Medical):   Marland Kitchen Lack of Transportation (Non-Medical):   Physical Activity:   . Days of Exercise per Week:   . Minutes of Exercise per Session:   Stress:   . Feeling of Stress :   Social Connections:   . Frequency of Communication with Friends and Family:   . Frequency of Social Gatherings with Friends and Family:   . Attends Religious Services:   . Active Member of Clubs or Organizations:   . Attends Archivist Meetings:   Marland Kitchen Marital Status:   Intimate Partner Violence:   . Fear of Current or Ex-Partner:   . Emotionally Abused:   Marland Kitchen Physically Abused:   . Sexually Abused:     Family History:    Family History  Problem Relation Age of Onset  . Heart  attack Paternal Grandfather        In his 86s     ROS:  Please see the history of present illness.  All other ROS reviewed and negative.     Physical Exam/Data:   Vitals:   09/06/19 0930 09/06/19 0945 09/06/19 1045 09/06/19 1100  BP: 115/89 101/67 109/81 115/84  Pulse: (!) 148 (!) 108 (!) 146 (!) 140  Resp: (!) 31 (!) 34 (!) 32 (!) 31  Temp:      TempSrc:      SpO2: 90% 96% 94% 93%  Weight:      Height:        Intake/Output Summary (Last 24 hours) at 09/06/2019 1130 Last data filed at 09/06/2019 0154 Gross per 24 hour  Intake 4600 ml  Output --  Net 4600 ml   Last 3 Weights September 18, 2019 08/30/2013 08/30/2013  Weight (lbs) 220 lb 241 lb 10 oz 232 lb 5.8 oz  Weight (kg) 99.791 kg 109.6 kg 105.4 kg     Body mass index is 29.03 kg/m.  General: Ill appearing WM on NRB mask with increased WOB Head: Normocephalic, atraumatic, sclera non-icteric, no xanthomas, nares are without discharge. Neck: Negative for carotid bruits. JVP not elevated. Lungs: Diffusely diminished without obvious wheezes, rales or rhonchi; increased WOB especially after mild movements in bed Heart: Irregularly irregular, tachycardic, S1/S2 without murmurs, rubs, or gallops.  Abdomen: Soft,  non-tender, non-distended with normoactive bowel sounds. No rebound/guarding. Extremities: No clubbing or cyanosis. No edema. Distal pedal pulses are 2+ and equal bilaterally. Neuro: Alert and oriented X 3. Moves all extremities spontaneously. Psych:  Labile affect  EKG:  The EKG was personally reviewed and demonstrates:  Reviewed multiple tracings. First EKGs are read as SVT but suspect more likely were AF RVR. Most recent tracing 09/06/19 shows atrial fib 158bpm, small Q waves in III, avF with subtle appearance of elevation of baseline ST segment compared to PR in these leads but does not meet STEMI criteria, nonspecific STT changes otherwise. Reviewed with Dr. Cristal Deer. Q wave morphology appears similar to 2015 EKG in II, avF but voltage is lower than before.  Telemetry:  Telemetry was personally reviewed and demonstrates:  Atrial fib RVR 110s-150s with intermittent NSR for brief periods of time  Relevant CV Studies: None   Laboratory Data:  High Sensitivity Troponin:  No results for input(s): TROPONINIHS in the last 720 hours.   Chemistry Recent Labs  Lab 09/18/19 1616 09-18-2019 1628 09/06/19 0429  NA 128* 130* 131*  K 3.1* 2.9* 3.8  CL 87*  --  97*  CO2 22  --  19*  GLUCOSE 355*  --  306*  BUN 24*  --  22  CREATININE 0.92  --  0.71  CALCIUM 8.5*  --  7.6*  GFRNONAA >60  --  >60  GFRAA >60  --  >60  ANIONGAP 19*  --  15    Recent Labs  Lab 09-18-19 1616 09/06/19 0429  PROT 7.2 5.8*  ALBUMIN 2.4* 2.1*  AST 20 644*  ALT 16 260*  ALKPHOS 116 115  BILITOT 1.0 1.5*   Hematology Recent Labs  Lab September 18, 2019 1616 09-18-19 1616 September 18, 2019 1628 09/18/19 2209 09/06/19 0429  WBC 23.9*  --   --  18.2* 19.3*  RBC 3.93*  --   --  3.45* 3.23*  HGB 12.7*   < > 12.9* 11.2* 10.6*  HCT 37.7*   < > 38.0* 34.1* 32.0*  MCV 95.9  --   --  98.8 99.1  MCH 32.3  --   --  32.5 32.8  MCHC 33.7  --   --  32.8 33.1  RDW 13.8  --   --  14.2 14.4  PLT PLATELET CLUMPS NOTED ON SMEAR,  COUNT APPEARS DECREASED  --   --  53* 41*   < > = values in this interval not displayed.   BNPNo results for input(s): BNP, PROBNP in the last 168 hours.  DDimer  Recent Labs  Lab 08/31/2019 1616  DDIMER 3.12*     Radiology/Studies:  CT Head Wo Contrast  Result Date: 08/23/2019 CLINICAL DATA:  Headache EXAM: CT HEAD WITHOUT CONTRAST TECHNIQUE: Contiguous axial images were obtained from the base of the skull through the vertex without intravenous contrast. COMPARISON:  None. FINDINGS: Brain: Mild age related volume loss. No acute intracranial abnormality. Specifically, no hemorrhage, hydrocephalus, mass lesion, acute infarction, or significant intracranial injury. Vascular: No hyperdense vessel or unexpected calcification. Skull: No acute calvarial abnormality. Sinuses/Orbits: Visualized paranasal sinuses and mastoids clear. Orbital soft tissues unremarkable. Other: None IMPRESSION: No acute intracranial abnormality. Electronically Signed   By: Charlett NoseKevin  Dover M.D.   On: 09/10/2019 19:54   DG Chest Port 1 View  Result Date: 09/02/2019 CLINICAL DATA:  Cough, fever EXAM: PORTABLE CHEST 1 VIEW COMPARISON:  09/01/2013 FINDINGS: Single frontal view of the chest demonstrates a stable cardiac silhouette. There are multiple prior healed left rib fractures. Linear consolidation at the left lung base likely reflects scarring. No airspace disease, effusion, or pneumothorax. IMPRESSION: 1. No acute intrathoracic process. Electronically Signed   By: Sharlet SalinaMichael  Brown M.D.   On: 08/22/2019 16:38    Assessment and Plan:   1. Severe sepsis - unclear source. CXR negative for PNA. Covid and flu were negative. UA not convincing of UTI. CNS infection is in the differential but patient has declined lumbar puncture. He became acutely upset when I mentioned that ER had considered the LP and declined to discuss further. He is currently on broad spectrum antibiotics and acyclovir. Could consider additional standard  respiratory panel to assess other standard viral pathogens. Further management per IM/PCCM.  2. New onset atrial fibrillation with RVR - CHADSVASC is tentatively 1 for diabetes but has unknown EF (and will increase to 2 later this year for age). With thrombocytopenia and declining platelet count would be hesitant to anticoagulate. Suspect AF is driven by underlying infection. Given that this is paroxysmal in nature on telemetry with intermittent conversion to NSR, there is no role for DCCV at present time because as expected since he is still ill, he reverts back to atrial fibrillation easily. Agree with echo when HR more controlled. TSH wnl. Currently on diltiazem infusion at 15mg /hr. Soft BP at times although currently normotensive. Discussed acutely with Dr. Cristal Deerhristopher who had a more positive interaction with patient now that he has calmed down. His HR was in the 110 range when she was in the room. The recommendation for now is to allow time for the anti-infective agents to work, which will hopefully help improve heart rate. If need be, we can try a little bit of IV beta blocker as BP allows. Suggest to avoid amiodarone given his acute liver function abnormalities.  3. Acute hypoxic respiratory failure - initial CXR unrevealing but he has become progressively more hypoxic requiring NRB. CT angio planned. PCCM has been involved in admission as well. Will add troponin values to labs pending to exclude marked involvement of acute cardiac injury. Based on clinical presentation  I do expect some degree of troponemia but will need to assess trend.  4. Thrombocytopenia - felt possibly due to sepsis, concerning given acute liver abnormality as well. Hepatitis panel pending. Primary team managing.  5. Elevated liver enzymes - normal on admission then rose further. As above. Difficult to know at this stage whether intrinsic liver process or shock liver.  6. Uncontrolled DM type 2 - currently on insulin. Will need  OP f/u.  7. Elevated d-dimer - primary team plans CT angio which appears appropriate. Of note, EKG shows slightly lower voltage than prior tracings. Cardiac silhouette was stable on CXR yesterday. Will also review pericardial findings on CT when it comes back.  8. Tobacco abuse - smoked since age 82. Says he officially quit as of this admission and believes smoking got him to this point.  For questions or updates, please contact [facility] Please consult [email or web address] for contact info under   Signed, Laurann Montana, PA-C  09/06/2019 11:30 AM

## 2019-09-06 NOTE — ED Notes (Signed)
Blood Consent form at bedside 

## 2019-09-07 ENCOUNTER — Inpatient Hospital Stay (HOSPITAL_COMMUNITY): Payer: Self-pay

## 2019-09-07 DIAGNOSIS — I340 Nonrheumatic mitral (valve) insufficiency: Secondary | ICD-10-CM

## 2019-09-07 DIAGNOSIS — D696 Thrombocytopenia, unspecified: Secondary | ICD-10-CM

## 2019-09-07 DIAGNOSIS — I361 Nonrheumatic tricuspid (valve) insufficiency: Secondary | ICD-10-CM

## 2019-09-07 LAB — CBC WITH DIFFERENTIAL/PLATELET
Abs Immature Granulocytes: 2.17 10*3/uL — ABNORMAL HIGH (ref 0.00–0.07)
Basophils Absolute: 0.2 10*3/uL — ABNORMAL HIGH (ref 0.0–0.1)
Basophils Relative: 1 %
Eosinophils Absolute: 0 10*3/uL (ref 0.0–0.5)
Eosinophils Relative: 0 %
HCT: 32 % — ABNORMAL LOW (ref 39.0–52.0)
Hemoglobin: 10.8 g/dL — ABNORMAL LOW (ref 13.0–17.0)
Immature Granulocytes: 9 %
Lymphocytes Relative: 1 %
Lymphs Abs: 0.4 10*3/uL — ABNORMAL LOW (ref 0.7–4.0)
MCH: 32.3 pg (ref 26.0–34.0)
MCHC: 33.8 g/dL (ref 30.0–36.0)
MCV: 95.8 fL (ref 80.0–100.0)
Monocytes Absolute: 4.2 10*3/uL — ABNORMAL HIGH (ref 0.1–1.0)
Monocytes Relative: 17 %
Neutro Abs: 17.6 10*3/uL — ABNORMAL HIGH (ref 1.7–7.7)
Neutrophils Relative %: 72 %
Platelets: 35 10*3/uL — ABNORMAL LOW (ref 150–400)
RBC: 3.34 MIL/uL — ABNORMAL LOW (ref 4.22–5.81)
RDW: 14.3 % (ref 11.5–15.5)
WBC: 24.5 10*3/uL — ABNORMAL HIGH (ref 4.0–10.5)
nRBC: 0.4 % — ABNORMAL HIGH (ref 0.0–0.2)

## 2019-09-07 LAB — COMPREHENSIVE METABOLIC PANEL
ALT: 352 U/L — ABNORMAL HIGH (ref 0–44)
AST: 358 U/L — ABNORMAL HIGH (ref 15–41)
Albumin: 2.1 g/dL — ABNORMAL LOW (ref 3.5–5.0)
Alkaline Phosphatase: 126 U/L (ref 38–126)
Anion gap: 13 (ref 5–15)
BUN: 19 mg/dL (ref 8–23)
CO2: 23 mmol/L (ref 22–32)
Calcium: 7.6 mg/dL — ABNORMAL LOW (ref 8.9–10.3)
Chloride: 96 mmol/L — ABNORMAL LOW (ref 98–111)
Creatinine, Ser: 0.77 mg/dL (ref 0.61–1.24)
GFR calc Af Amer: >60 mL/min (ref >60)
GFR calc non Af Amer: >60 mL/min (ref >60)
Glucose, Bld: 289 mg/dL — ABNORMAL HIGH (ref 70–99)
Potassium: 3.3 mmol/L — ABNORMAL LOW (ref 3.5–5.1)
Sodium: 132 mmol/L — ABNORMAL LOW (ref 135–145)
Total Bilirubin: 1.3 mg/dL — ABNORMAL HIGH (ref 0.3–1.2)
Total Protein: 5.6 g/dL — ABNORMAL LOW (ref 6.5–8.1)

## 2019-09-07 LAB — POCT I-STAT 7, (LYTES, BLD GAS, ICA,H+H)
Acid-base deficit: 2 mmol/L (ref 0.0–2.0)
Acid-base deficit: 12 mmol/L — ABNORMAL HIGH (ref 0.0–2.0)
Bicarbonate: 23.2 mmol/L (ref 20.0–28.0)
Bicarbonate: 23.1 mmol/L (ref 20.0–28.0)
Bicarbonate: 13.9 mmol/L — ABNORMAL LOW (ref 20.0–28.0)
Calcium, Ion: 1.09 mmol/L — ABNORMAL LOW (ref 1.15–1.40)
Calcium, Ion: 1.09 mmol/L — ABNORMAL LOW (ref 1.15–1.40)
Calcium, Ion: 1.01 mmol/L — ABNORMAL LOW (ref 1.15–1.40)
HCT: 32 % — ABNORMAL LOW (ref 39.0–52.0)
HCT: 31 % — ABNORMAL LOW (ref 39.0–52.0)
HCT: 33 % — ABNORMAL LOW (ref 39.0–52.0)
Hemoglobin: 10.9 g/dL — ABNORMAL LOW (ref 13.0–17.0)
Hemoglobin: 10.5 g/dL — ABNORMAL LOW (ref 13.0–17.0)
Hemoglobin: 11.2 g/dL — ABNORMAL LOW (ref 13.0–17.0)
O2 Saturation: 88 %
O2 Saturation: 91 %
O2 Saturation: 94 %
Patient temperature: 99.3
Patient temperature: 98.7
Patient temperature: 100.9
Potassium: 3.2 mmol/L — ABNORMAL LOW (ref 3.5–5.1)
Potassium: 3.5 mmol/L (ref 3.5–5.1)
Potassium: 4.1 mmol/L (ref 3.5–5.1)
Sodium: 132 mmol/L — ABNORMAL LOW (ref 135–145)
Sodium: 132 mmol/L — ABNORMAL LOW (ref 135–145)
Sodium: 134 mmol/L — ABNORMAL LOW (ref 135–145)
TCO2: 24 mmol/L (ref 22–32)
TCO2: 24 mmol/L (ref 22–32)
TCO2: 15 mmol/L — ABNORMAL LOW (ref 22–32)
pCO2 arterial: 32.4 mmHg (ref 32.0–48.0)
pCO2 arterial: 38.1 mmHg (ref 32.0–48.0)
pCO2 arterial: 33 mmHg (ref 32.0–48.0)
pH, Arterial: 7.464 — ABNORMAL HIGH (ref 7.350–7.450)
pH, Arterial: 7.391 (ref 7.350–7.450)
pH, Arterial: 7.239 — ABNORMAL LOW (ref 7.350–7.450)
pO2, Arterial: 52 mmHg — ABNORMAL LOW (ref 83.0–108.0)
pO2, Arterial: 61 mmHg — ABNORMAL LOW (ref 83.0–108.0)
pO2, Arterial: 89 mmHg (ref 83.0–108.0)

## 2019-09-07 LAB — GLUCOSE, CAPILLARY
Glucose-Capillary: 288 mg/dL — ABNORMAL HIGH (ref 70–99)
Glucose-Capillary: 253 mg/dL — ABNORMAL HIGH (ref 70–99)
Glucose-Capillary: 247 mg/dL — ABNORMAL HIGH (ref 70–99)
Glucose-Capillary: 252 mg/dL — ABNORMAL HIGH (ref 70–99)
Glucose-Capillary: 228 mg/dL — ABNORMAL HIGH (ref 70–99)
Glucose-Capillary: 237 mg/dL — ABNORMAL HIGH (ref 70–99)

## 2019-09-07 LAB — BASIC METABOLIC PANEL
Anion gap: 17 — ABNORMAL HIGH (ref 5–15)
BUN: 33 mg/dL — ABNORMAL HIGH (ref 8–23)
CO2: 14 mmol/L — ABNORMAL LOW (ref 22–32)
Calcium: 6.9 mg/dL — ABNORMAL LOW (ref 8.9–10.3)
Chloride: 102 mmol/L (ref 98–111)
Creatinine, Ser: 1.38 mg/dL — ABNORMAL HIGH (ref 0.61–1.24)
GFR calc Af Amer: >60 mL/min (ref >60)
GFR calc non Af Amer: 54 mL/min — ABNORMAL LOW (ref >60)
Glucose, Bld: 236 mg/dL — ABNORMAL HIGH (ref 70–99)
Potassium: 4.2 mmol/L (ref 3.5–5.1)
Sodium: 133 mmol/L — ABNORMAL LOW (ref 135–145)

## 2019-09-07 LAB — CBC
HCT: 31.3 % — ABNORMAL LOW (ref 39.0–52.0)
Hemoglobin: 10 g/dL — ABNORMAL LOW (ref 13.0–17.0)
MCH: 32.2 pg (ref 26.0–34.0)
MCHC: 31.9 g/dL (ref 30.0–36.0)
MCV: 100.6 fL — ABNORMAL HIGH (ref 80.0–100.0)
Platelets: 36 10*3/uL — ABNORMAL LOW (ref 150–400)
RBC: 3.11 MIL/uL — ABNORMAL LOW (ref 4.22–5.81)
RDW: 15.1 % (ref 11.5–15.5)
WBC: 37.5 10*3/uL — ABNORMAL HIGH (ref 4.0–10.5)
nRBC: 1.2 % — ABNORMAL HIGH (ref 0.0–0.2)

## 2019-09-07 LAB — PHOSPHORUS: Phosphorus: 3.7 mg/dL (ref 2.5–4.6)

## 2019-09-07 LAB — MAGNESIUM: Magnesium: 2.3 mg/dL (ref 1.7–2.4)

## 2019-09-07 LAB — ECHOCARDIOGRAM COMPLETE
Height: 73 in
Weight: 3520 oz

## 2019-09-07 LAB — STREP PNEUMONIAE URINARY ANTIGEN: Strep Pneumo Urinary Antigen: NEGATIVE

## 2019-09-07 LAB — LACTIC ACID, PLASMA: Lactic Acid, Venous: 10.3 mmol/L (ref 0.5–1.9)

## 2019-09-07 MED ORDER — SODIUM BICARBONATE 8.4 % IV SOLN
100.0000 meq | Freq: Once | INTRAVENOUS | Status: AC
  Administered 2019-09-07: 100 meq via INTRAVENOUS

## 2019-09-07 MED ORDER — FENTANYL CITRATE (PF) 100 MCG/2ML IJ SOLN
INTRAMUSCULAR | Status: AC
  Filled 2019-09-07: qty 2

## 2019-09-07 MED ORDER — MIDAZOLAM HCL 2 MG/2ML IJ SOLN: 2.0000 mg | INTRAMUSCULAR | Status: DC | PRN

## 2019-09-07 MED ORDER — FENTANYL 2500MCG IN NS 250ML (10MCG/ML) PREMIX INFUSION
50.0000 ug/h | INTRAVENOUS | Status: DC
  Administered 2019-09-07 – 2019-09-08 (×2): 50 ug/h via INTRAVENOUS
  Filled 2019-09-07 (×2): qty 250

## 2019-09-07 MED ORDER — ROCURONIUM BROMIDE 50 MG/5ML IV SOLN
70.0000 mg | Freq: Once | INTRAVENOUS | Status: AC
  Administered 2019-09-07: 70 mg via INTRAVENOUS

## 2019-09-07 MED ORDER — CHLORHEXIDINE GLUCONATE 0.12% ORAL RINSE (MEDLINE KIT)
15.0000 mL | Freq: Two times a day (BID) | OROMUCOSAL | Status: DC
  Administered 2019-09-07 – 2019-09-08 (×2): 15 mL via OROMUCOSAL

## 2019-09-07 MED ORDER — NOREPINEPHRINE 4 MG/250ML-% IV SOLN
0.0000 ug/min | INTRAVENOUS | Status: DC
  Administered 2019-09-07: 30 ug/min via INTRAVENOUS
  Administered 2019-09-07: 6 ug/min via INTRAVENOUS
  Administered 2019-09-08 (×3): 40 ug/min via INTRAVENOUS
  Administered 2019-09-08: 35 ug/min via INTRAVENOUS
  Administered 2019-09-08: 40 ug/min via INTRAVENOUS
  Administered 2019-09-08: 60 ug/min via INTRAVENOUS
  Administered 2019-09-08: 24 ug/min via INTRAVENOUS
  Administered 2019-09-08: 80 ug/min via INTRAVENOUS
  Administered 2019-09-08: 40 ug/min via INTRAVENOUS
  Filled 2019-09-07: qty 250
  Filled 2019-09-07: qty 500
  Filled 2019-09-07: qty 250
  Filled 2019-09-07: qty 750
  Filled 2019-09-07 (×4): qty 250
  Filled 2019-09-07: qty 750
  Filled 2019-09-07: qty 250

## 2019-09-07 MED ORDER — NOREPINEPHRINE 4 MG/250ML-% IV SOLN
INTRAVENOUS | Status: AC
  Filled 2019-09-07: qty 250

## 2019-09-07 MED ORDER — POTASSIUM CHLORIDE 20 MEQ/15ML (10%) PO SOLN
40.0000 meq | Freq: Once | ORAL | Status: AC
  Administered 2019-09-07: 40 meq via ORAL

## 2019-09-07 MED ORDER — FENTANYL CITRATE (PF) 100 MCG/2ML IJ SOLN
100.0000 ug | Freq: Once | INTRAMUSCULAR | Status: AC
  Administered 2019-09-07: 100 ug via INTRAVENOUS

## 2019-09-07 MED ORDER — PRO-STAT SUGAR FREE PO LIQD
30.0000 mL | Freq: Two times a day (BID) | ORAL | Status: DC
  Administered 2019-09-07: 30 mL
  Filled 2019-09-07: qty 30

## 2019-09-07 MED ORDER — PROPOFOL 1000 MG/100ML IV EMUL
0.0000 ug/kg/min | INTRAVENOUS | Status: DC
  Administered 2019-09-07: 30 ug/kg/min via INTRAVENOUS
  Administered 2019-09-07: 25 ug/kg/min via INTRAVENOUS
  Administered 2019-09-08: 35 ug/kg/min via INTRAVENOUS
  Administered 2019-09-08: 20 ug/kg/min via INTRAVENOUS
  Administered 2019-09-08: 25 ug/kg/min via INTRAVENOUS
  Filled 2019-09-07 (×6): qty 100

## 2019-09-07 MED ORDER — SODIUM CHLORIDE 0.9 % IV SOLN
2.0000 g | Freq: Two times a day (BID) | INTRAVENOUS | Status: DC
  Administered 2019-09-07: 2 g via INTRAVENOUS
  Filled 2019-09-07: qty 20

## 2019-09-07 MED ORDER — PHENYLEPHRINE HCL-NACL 10-0.9 MG/250ML-% IV SOLN
0.0000 ug/min | INTRAVENOUS | Status: DC
  Administered 2019-09-07: 50 ug/min via INTRAVENOUS
  Filled 2019-09-07 (×2): qty 250

## 2019-09-07 MED ORDER — FAMOTIDINE 40 MG/5ML PO SUSR
20.0000 mg | Freq: Two times a day (BID) | ORAL | Status: DC
  Administered 2019-09-07 – 2019-09-08 (×3): 20 mg
  Filled 2019-09-07 (×3): qty 2.5

## 2019-09-07 MED ORDER — SODIUM CHLORIDE 0.9 % IV SOLN: INTRAVENOUS | Status: DC | PRN

## 2019-09-07 MED ORDER — ACETAMINOPHEN 650 MG RE SUPP: 650.0000 mg | Freq: Four times a day (QID) | RECTAL | Status: DC | PRN

## 2019-09-07 MED ORDER — POTASSIUM CHLORIDE 20 MEQ/15ML (10%) PO SOLN
ORAL | Status: AC
  Filled 2019-09-07: qty 15

## 2019-09-07 MED ORDER — MIDAZOLAM HCL 2 MG/2ML IJ SOLN
2.0000 mg | Freq: Once | INTRAMUSCULAR | Status: AC
  Administered 2019-09-07: 2 mg via INTRAVENOUS

## 2019-09-07 MED ORDER — FENTANYL BOLUS VIA INFUSION
50.0000 ug | INTRAVENOUS | Status: DC | PRN
  Filled 2019-09-07: qty 50

## 2019-09-07 MED ORDER — HYDROCORTISONE NA SUCCINATE PF 100 MG IJ SOLR
100.0000 mg | Freq: Four times a day (QID) | INTRAMUSCULAR | Status: DC
  Administered 2019-09-07 – 2019-09-08 (×2): 100 mg via INTRAVENOUS
  Filled 2019-09-07 (×3): qty 2

## 2019-09-07 MED ORDER — FUROSEMIDE 10 MG/ML IJ SOLN
20.0000 mg | Freq: Once | INTRAMUSCULAR | Status: AC
  Administered 2019-09-07: 20 mg via INTRAVENOUS

## 2019-09-07 MED ORDER — ACETAMINOPHEN 325 MG PO TABS: 650.0000 mg | ORAL_TABLET | Freq: Four times a day (QID) | ORAL | Status: DC | PRN

## 2019-09-07 MED ORDER — SODIUM CHLORIDE 0.9 % IV BOLUS
1000.0000 mL | Freq: Once | INTRAVENOUS | Status: AC
  Administered 2019-09-07: 1000 mL via INTRAVENOUS

## 2019-09-07 MED ORDER — AMIODARONE IV BOLUS ONLY 150 MG/100ML
150.0000 mg | Freq: Once | INTRAVENOUS | Status: AC
  Administered 2019-09-07: 150 mg via INTRAVENOUS
  Filled 2019-09-07: qty 100

## 2019-09-07 MED ORDER — SODIUM CHLORIDE 0.9 % IV SOLN
2.0000 g | INTRAVENOUS | Status: DC
  Filled 2019-09-07: qty 20

## 2019-09-07 MED ORDER — PHENYLEPHRINE CONCENTRATED 100MG/250ML (0.4 MG/ML) INFUSION SIMPLE
0.0000 ug/min | INTRAVENOUS | Status: DC
  Administered 2019-09-07 – 2019-09-08 (×5): 400 ug/min via INTRAVENOUS
  Filled 2019-09-07 (×8): qty 250

## 2019-09-07 MED ORDER — MIDAZOLAM HCL 2 MG/2ML IJ SOLN
INTRAMUSCULAR | Status: AC
  Filled 2019-09-07: qty 2

## 2019-09-07 MED ORDER — ETOMIDATE 2 MG/ML IV SOLN
20.0000 mg | Freq: Once | INTRAVENOUS | Status: AC
  Administered 2019-09-07: 20 mg via INTRAVENOUS

## 2019-09-07 MED ORDER — DOCUSATE SODIUM 50 MG/5ML PO LIQD: 100.0000 mg | Freq: Two times a day (BID) | ORAL | Status: DC | PRN

## 2019-09-07 MED ORDER — INSULIN ASPART 100 UNIT/ML ~~LOC~~ SOLN
0.0000 [IU] | SUBCUTANEOUS | Status: DC
  Administered 2019-09-07: 11 [IU] via SUBCUTANEOUS
  Administered 2019-09-07 (×3): 7 [IU] via SUBCUTANEOUS
  Administered 2019-09-08: 11 [IU] via SUBCUTANEOUS
  Administered 2019-09-08 (×3): 7 [IU] via SUBCUTANEOUS

## 2019-09-07 MED ORDER — VASOPRESSIN 20 UNIT/ML IV SOLN
0.0300 [IU]/min | INTRAVENOUS | Status: DC
  Administered 2019-09-07: 0.03 [IU]/min via INTRAVENOUS
  Filled 2019-09-07 (×4): qty 2

## 2019-09-07 MED ORDER — FUROSEMIDE 10 MG/ML IJ SOLN
INTRAMUSCULAR | Status: AC
  Filled 2019-09-07: qty 2

## 2019-09-07 MED ORDER — VITAL HIGH PROTEIN PO LIQD
1000.0000 mL | ORAL | Status: DC
  Administered 2019-09-07: 1000 mL

## 2019-09-07 MED ORDER — INSULIN GLARGINE 100 UNIT/ML ~~LOC~~ SOLN
10.0000 [IU] | Freq: Two times a day (BID) | SUBCUTANEOUS | Status: DC
  Administered 2019-09-07 – 2019-09-08 (×2): 10 [IU] via SUBCUTANEOUS
  Filled 2019-09-07 (×3): qty 0.1

## 2019-09-07 MED ORDER — LACTATED RINGERS IV BOLUS
1000.0000 mL | Freq: Once | INTRAVENOUS | Status: AC
  Administered 2019-09-07: 1000 mL via INTRAVENOUS

## 2019-09-07 MED ORDER — VITAL HIGH PROTEIN PO LIQD
1000.0000 mL | ORAL | Status: DC
  Administered 2019-09-07 – 2019-09-08 (×2): 1000 mL

## 2019-09-07 MED ORDER — PRO-STAT SUGAR FREE PO LIQD
30.0000 mL | Freq: Every day | ORAL | Status: DC
  Administered 2019-09-08: 30 mL
  Filled 2019-09-07: qty 30

## 2019-09-07 MED ORDER — FUROSEMIDE 10 MG/ML IJ SOLN
20.0000 mg | Freq: Once | INTRAMUSCULAR | Status: AC
  Administered 2019-09-07: 20 mg via INTRAVENOUS
  Filled 2019-09-07: qty 2

## 2019-09-07 MED ORDER — PROPOFOL 1000 MG/100ML IV EMUL
INTRAVENOUS | Status: AC
  Filled 2019-09-07: qty 100

## 2019-09-07 MED ORDER — BISACODYL 10 MG RE SUPP: 10.0000 mg | Freq: Every day | RECTAL | Status: DC | PRN

## 2019-09-07 MED ORDER — ORAL CARE MOUTH RINSE
15.0000 mL | OROMUCOSAL | Status: DC
  Administered 2019-09-07 – 2019-09-08 (×14): 15 mL via OROMUCOSAL

## 2019-09-07 NOTE — Procedures (Signed)
Arterial Catheter Insertion Procedure Note Derek [name] [id number] August 12, 1955  Procedure: Insertion of Arterial Catheter  Indications: Blood pressure monitoring and Frequent blood sampling  Procedure Details Consent: Unable to obtain consent because of altered level of consciousness. Time Out: Verified patient identification, verified procedure, site/side was marked, verified correct patient position, special equipment/implants available, medications/allergies/relevent history reviewed, required imaging and test results available.  Performed  Maximum sterile technique was used including antiseptics, cap, gloves, gown, hand hygiene, mask and sheet. Skin prep: Chlorhexidine; local anesthetic administered 20 gauge catheter was inserted into right radial artery using the Seldinger technique. ULTRASOUND GUIDANCE USED: NO Evaluation Blood flow good; BP tracing good. Complications: No apparent complications.   [name] 09/07/2019

## 2019-09-07 NOTE — Progress Notes (Signed)
eLink Physician-Brief Progress Note Patient Name: Derek [name] DOB: 11-Apr-1956 MRN: [id number]   Date of Service  09/07/2019  HPI/Events of Note  FIO2 100 %,  PEEP 12, saturation 88 %  eICU Interventions  PEEP increased to 14. ABG at 2:00 a.m.        Derek [name] 09/07/2019, 11:41 PM

## 2019-09-07 NOTE — Progress Notes (Signed)
NAME:  Derek [name], MRN:  [id number], DOB:  [birth year], LOS: 2 ADMISSION DATE:  08/27/2019, CONSULTATION DATE: 3/25 REFERRING MD: Dr. Antionette Char, CHIEF COMPLAINT: Sepsis  Brief History   64 yo male smoker presented with nausea, dyspnea, myalgias, fever, cough.  Found to have PNA with hypoxia and A fib with RVR.  Past Medical History  DM, PTX 2015  Significant Hospital Events   3/25 admit 3/26 add amiodarone for A fib with RVR  Consults:  Cardiology  Procedures:    Significant Diagnostic Tests:  CT head 3/25 > mild volume loss CT angio chest 3/25 >> basilar ASD, Rt effusion  Micro Data:  SARS CoV2 PCR 3/24 >> negative Influenza PCR 3/24 >> negative Blood 3/24 >> MRSA screen 3/24 >> negative HIV 3/24 >> negative Legionella Ag 3/26 >> Pneumococcal Ag 3/26 >>  Antimicrobials:  Vancomycin 3/24 >> 3/25 Rocephin 3/24 >> Ampicillin 3/24 >> 3/25 Zithromax 3/24 >> Acyclovir 3/25  Interim history/subjective:  Remains on high flow oxygen with NRB.  On amiodarone and cardizem gtt.  Objective   Blood pressure 98/69, pulse (!) 130, temperature 97.9 F (36.6 C), temperature source Axillary, resp. rate (!) 21, height 6\' 1"  (1.854 m), weight 99.8 kg, SpO2 (!) 83 %.    FiO2 (%):  [80 %-100 %] 100 %   Intake/Output Summary (Last 24 hours) at 09/07/2019 0919 Last data filed at 09/07/2019 0700 Gross per 24 hour  Intake 4574.34 ml  Output 1625 ml  Net 2949.34 ml   Filed Weights   08/25/2019 1807  Weight: 99.8 kg    Examination:  General - alert Eyes - pupils reactive ENT - no sinus tenderness, no stridor Cardiac - irregular, tachycardic Chest - b/l rhonchi, decreased BS Rt base, using accessory muscles Abdomen - soft, non tender, + bowel sounds Extremities - no edema Skin - no rashes Neuro - normal strength, moves extremities, follows commands Psych - anxious   Resolved Hospital Problem list   Meningitis ruled out  Assessment & Plan:   Acute hypoxic respiratory  failure from community acquired bacterial lobar pneumonia of Rt lower lobe. Tobacco abuse. - day 3 of ABx - goal SpO2 > 92% - prn BDs - he likely needs intubation >> he is considering his options at this time  A fib with RVR. Elevated troponin from demand ischemia. - likely all be driving by pneumonia with respiratory failure and increased sympathetic drive continue amiodarone for now - wean off cardizem as able - f/u Echo  Hypokalemia. - replace as needed  Elevated LFTs from hypoxia, hypotension. - f/u CMET  DM type II poorly controlled with hyperglycemia. - SSI  Anemia of critical illness. Sepsis induced thrombocytopenia. - f/u CBC - transfuse for Hb < 7, PLT < 10K, or significant bleeding  Acute metabolic encephalopathy from sepsis, hypoxia. Chronic pain on chronic opiate medication. - monitor mental status  Best practice:  Diet: NPO DVT prophylaxis: SCDs SUP: Pepcid Mobility: bed rest Goals of care: full code Disposition: ICU  Labs:   CMP Latest Ref Rng & Units 09/07/2019 09/07/2019 09/06/2019  Glucose 70 - 99 mg/dL - 08/15/2019) 381(W)  BUN 8 - 23 mg/dL - 19 21  Creatinine 299(B - 1.24 mg/dL - 7.16 9.67  Sodium 8.93 - 145 mmol/L 132(L) 132(L) 132(L)  Potassium 3.5 - 5.1 mmol/L 3.2(L) 3.3(L) 3.1(L)  Chloride 98 - 111 mmol/L - 96(L) 99  CO2 22 - 32 mmol/L - 23 22  Calcium 8.9 - 10.3 mg/dL - 7.6(L) 7.5(L)  Total  Protein 6.5 - 8.1 g/dL - 5.6(L) -  Total Bilirubin 0.3 - 1.2 mg/dL - 1.3(H) -  Alkaline Phos 38 - 126 U/L - 126 -  AST 15 - 41 U/L - 358(H) -  ALT 0 - 44 U/L - 352(H) -    CBC Latest Ref Rng & Units 09/07/2019 09/07/2019 09/06/2019  WBC 4.0 - 10.5 K/uL - 24.5(H) -  Hemoglobin 13.0 - 17.0 g/dL 10.9(L) 10.8(L) 10.5(L)  Hematocrit 39.0 - 52.0 % 32.0(L) 32.0(L) 31.0(L)  Platelets 150 - 400 K/uL - 35(L) -    ABG    Component Value Date/Time   PHART 7.464 (H) 09/07/2019 0421   PCO2ART 32.4 09/07/2019 0421   PO2ART 52.0 (L) 09/07/2019 0421   HCO3 23.2  09/07/2019 0421   TCO2 24 09/07/2019 0421   ACIDBASEDEF 1.0 09/06/2019 1503   O2SAT 88.0 09/07/2019 0421    CBG (last 3)  Recent Labs    09/06/19 2328 09/07/19 0316 09/07/19 0743  GLUCAP 224* 288* 253*    Critical care time: 39 minutes.   Chesley Mires, MD [facility] Pulmonary/Critical Care 09/07/2019, 9:31 AM

## 2019-09-07 NOTE — Progress Notes (Signed)
Asked to see patient due to hypoxemia, RR of 35 on BIPAP.  Placed on non rebreather 100% and still with high respiratory rate.  Remains in Afib with rate 140.  Patient fully awake and not dyspneic, insists he feels less dyspneic on non rebreather.  A high flow nasal cannula was added.  I discussed intubation with patient and he insists he wants to give [place] and mask, both at 100% for a while.  On this his 02 sat is 90%, essentially what it was on BIPAP.  Will recheck in 1 hour for need for intubation.

## 2019-09-07 NOTE — Progress Notes (Signed)
eLink Physician-Brief Progress Note Patient Name: Derek [name] DOB: 02/20/1956 MRN: [id number]   Date of Service  09/07/2019  HPI/Events of Note  Increased WOB, RR = 32-44 and Sat = 87%. Intubation?  eICU Interventions  Will order: 1. ABG now. 2. Will ask ground team to evaluate the patient at bedside.      Intervention Category Major Interventions: Hypoxemia - evaluation and management  Malloree Raboin Eugene 09/07/2019, 4:15 AM

## 2019-09-07 NOTE — Procedures (Signed)
Intubation Procedure Note Derek [name] [id number] 01/02/1956  Procedure: Intubation Indications: Respiratory insufficiency  Procedure Details Consent: Risks of procedure as well as the alternatives and risks of each were explained to the (patient/caregiver).  Consent for procedure obtained. Time Out: Verified patient identification, verified procedure, site/side was marked, verified correct patient position, special equipment/implants available, medications/allergies/relevent history reviewed, required imaging and test results available.  Performed  Maximum sterile technique was used including cap, gloves, gown and hand hygiene.  4    Evaluation Hemodynamic Status: Transient hypotension treated with pressors and fluid; O2 sats: transiently fell during during procedure Patient's Current Condition: stable Complications: No apparent complications Patient did tolerate procedure well. Chest X-ray ordered to verify placement.  CXR: pending   Pt. Received 20 mg of etomidate,70 mg Rocuronium,2 mg Versed and 100 mcg Fentanyl prior to intubation. Dr. Craige Cotta was present at the bedside for the entire procedure. CXR is pending This was a shared procedure with Derek [name]. NP .   Derek [name] 09/07/2019

## 2019-09-07 NOTE — Progress Notes (Signed)
BP still soft after neo maxed out. MD notified.

## 2019-09-07 NOTE — Progress Notes (Signed)
MD paged for refractory hypotension

## 2019-09-07 NOTE — Progress Notes (Signed)
Inpatient Diabetes Program Recommendations  AACE/ADA: New Consensus Statement on Inpatient Glycemic Control (2015)  Target Ranges:  Prepandial:   less than 140 mg/dL      Peak postprandial:   less than 180 mg/dL (1-2 hours)      Critically ill patients:  140 - 180 mg/dL   Lab Results  Component Value Date   GLUCAP 253 (H) 09/07/2019   HGBA1C 9.0 (H) 09/06/2019    Review of Glycemic Control Results for Derek [name], CINA (MRN [id number]) as of 09/07/2019 10:41  Ref. Range 09/06/2019 19:10 09/06/2019 23:28 09/07/2019 03:16 09/07/2019 07:43  Glucose-Capillary Latest Ref Range: 70 - 99 mg/dL 409 (H) 811 (H) 914 (H) 253 (H)   Diabetes history: Type 2 DM Outpatient Diabetes medications: Metformin 500 mg BID Current orders for Inpatient glycemic control: Lantus 10 units QD, Novolog 0-20 units Q4H  Inpatient Diabetes Program Recommendations:    Consider increasing Lantus to 10 units BID.   Thanks, Lujean Rave, MSN, RNC-OB Diabetes Coordinator [phone] (8a-5p)

## 2019-09-07 NOTE — Progress Notes (Signed)
eLink Physician-Brief Progress Note Patient Name: Derek [name] DOB: [birth year] MRN: [id number]   Date of Service  09/07/2019  HPI/Events of Note  AFIB with RVR - Ventricular rate now = 119-130's. Remains on Amiodarone IV infusion.   eICU Interventions  Will order: 1. Amiodarone 150 mg IV over 10 minutes now.      Intervention Category Major Interventions: Arrhythmia - evaluation and management  Lucio Litsey Eugene 09/07/2019, 3:12 AM

## 2019-09-07 NOTE — Progress Notes (Signed)
eLink Physician-Brief Progress Note Patient Name: Derek [name] DOB: July 03, 1955 MRN: [id number]   Date of Service  09/07/2019  HPI/Events of Note  Septic shock with hyper-lactemia and metabolic acidosis. Pt is also hypotensive on multiple pressors.  eICU Interventions  LR 1000 ml iv fluid bolus, sodium bicarbonate 2 amps iv x 1, solu-cortef 100 mg iv Q 6 hours.        Derek [name] 09/07/2019, 9:14 PM

## 2019-09-07 NOTE — Progress Notes (Signed)
Progress Note  Patient Name: Derek [name] Date of Encounter: 09/07/2019  Primary Cardiologist: Jodelle Red, MD  Subjective   Seen at bedside. On high flow nasal cannula and nonrebreather, saturating low 80s. PCCM at bedside preparing for intubation. He is conversationally dyspneic, briefly answers questions, notes he is tired.  Inpatient Medications    Scheduled Meds: . fentaNYL      . midazolam      . chlorhexidine  15 mL Mouth Rinse BID  . Chlorhexidine Gluconate Cloth  6 each Topical Daily  . influenza vac split quadrivalent PF  0.5 mL Intramuscular Tomorrow-1000  . insulin aspart  0-20 Units Subcutaneous Q4H  . insulin glargine  10 Units Subcutaneous Daily  . mouth rinse  15 mL Mouth Rinse q12n4p  . pneumococcal 23 valent vaccine  0.5 mL Intramuscular Tomorrow-1000  . sodium chloride flush  3 mL Intravenous Q12H   Continuous Infusions: . propofol    . sodium chloride Stopped (09/06/19 1714)  . amiodarone 60 mg/hr (09/07/19 0948)  . azithromycin Stopped (09/06/19 1543)  . cefTRIAXone (ROCEPHIN)  IV 2 g (09/07/19 0946)  . diltiazem (CARDIZEM) infusion 15 mg/hr (09/07/19 0700)  . famotidine (PEPCID) IV Stopped (09/06/19 2312)  . lactated ringers Stopped (09/07/19 0617)   PRN Meds: sodium chloride, acetaminophen **OR** acetaminophen, morphine injection, ondansetron **OR** ondansetron (ZOFRAN) IV   Vital Signs    Vitals:   09/07/19 0630 09/07/19 0700 09/07/19 0730 09/07/19 0800  BP: 100/76 98/69    Pulse: (!) 121 (!) 115 (!) 130   Resp: (!) 31 (!) 32 (!) 21   Temp:    97.9 F (36.6 C)  TempSrc:    Axillary  SpO2: (!) 88% (!) 88% (!) 83%   Weight:      Height:        Intake/Output Summary (Last 24 hours) at 09/07/2019 1000 Last data filed at 09/07/2019 0700 Gross per 24 hour  Intake 4574.34 ml  Output 1625 ml  Net 2949.34 ml   Last 3 Weights 2019/10/03 08/30/2013 08/30/2013  Weight (lbs) 220 lb 241 lb 10 oz 232 lb 5.8 oz  Weight (kg) 99.791 kg 109.6  kg 105.4 kg      Telemetry    afib RVR - Personally Reviewed  ECG    09/06/19 afib RVR - Personally Reviewed  Physical Exam  GEN: on high flow nasal cannula and nonrebreather, tachypneic, saturating in the low 80s HEENT: Normal, moist mucous membranes NECK: No JVD visible CARDIAC: rapid, irregularly irregular rhythm, no murmurs appreciated VASCULAR: Radial and DP pulses 2+ bilaterally. No carotid bruits RESPIRATORY:  Tachypneic, using accessory muscles, diffusely coarse, decreased breath sounds ABDOMEN: Soft, non-tender, non-distended MUSCULOSKELETAL:  Ambulates independently SKIN: Warm and dry, no edema NEUROLOGIC:  Alert and oriented x 3. No focal neuro deficits noted. PSYCHIATRIC:  Normal affect   Labs    High Sensitivity Troponin:   Recent Labs  Lab 09/06/19 1149 09/06/19 1340  TROPONINIHS 50* 48*      Chemistry Recent Labs  Lab Oct 03, 2019 1616 10/03/19 1628 09/06/19 0429 09/06/19 1503 09/06/19 2135 09/07/19 0348 09/07/19 0421  NA 128*   < > 131*   < > 132* 132* 132*  K 3.1*   < > 3.8   < > 3.1* 3.3* 3.2*  CL 87*   < > 97*  --  99 96*  --   CO2 22   < > 19*  --  22 23  --   GLUCOSE 355*   < >  306*  --  260* 289*  --   BUN 24*   < > 22  --  21 19  --   CREATININE 0.92   < > 0.71  --  0.72 0.77  --   CALCIUM 8.5*   < > 7.6*  --  7.5* 7.6*  --   PROT 7.2  --  5.8*  --   --  5.6*  --   ALBUMIN 2.4*  --  2.1*  --   --  2.1*  --   AST 20  --  644*  --   --  358*  --   ALT 16  --  260*  --   --  352*  --   ALKPHOS 116  --  115  --   --  126  --   BILITOT 1.0  --  1.5*  --   --  1.3*  --   GFRNONAA >60   < > >60  --  >60 >60  --   GFRAA >60   < > >60  --  >60 >60  --   ANIONGAP 19*   < > 15  --  11 13  --    < > = values in this interval not displayed.     Hematology Recent Labs  Lab 09-25-19 2209 09-25-2019 2209 09/06/19 0429 09/06/19 0429 09/06/19 1503 09/07/19 0348 09/07/19 0421  WBC 18.2*  --  19.3*  --   --  24.5*  --   RBC 3.45*  --  3.23*  --    --  3.34*  --   HGB 11.2*   < > 10.6*   < > 10.5* 10.8* 10.9*  HCT 34.1*   < > 32.0*   < > 31.0* 32.0* 32.0*  MCV 98.8  --  99.1  --   --  95.8  --   MCH 32.5  --  32.8  --   --  32.3  --   MCHC 32.8  --  33.1  --   --  33.8  --   RDW 14.2  --  14.4  --   --  14.3  --   PLT 53*  --  41*  --   --  35*  --    < > = values in this interval not displayed.    BNPNo results for input(s): BNP, PROBNP in the last 168 hours.   DDimer  Recent Labs  Lab September 25, 2019 1616  DDIMER 3.12*     Radiology    CT Head Wo Contrast  Result Date: 09/25/2019 CLINICAL DATA:  Headache EXAM: CT HEAD WITHOUT CONTRAST TECHNIQUE: Contiguous axial images were obtained from the base of the skull through the vertex without intravenous contrast. COMPARISON:  None. FINDINGS: Brain: Mild age related volume loss. No acute intracranial abnormality. Specifically, no hemorrhage, hydrocephalus, mass lesion, acute infarction, or significant intracranial injury. Vascular: No hyperdense vessel or unexpected calcification. Skull: No acute calvarial abnormality. Sinuses/Orbits: Visualized paranasal sinuses and mastoids clear. Orbital soft tissues unremarkable. Other: None IMPRESSION: No acute intracranial abnormality. Electronically Signed   By: Rolm Baptise M.D.   On: 2019-09-25 19:54   CT ANGIO CHEST PE W OR WO CONTRAST  Result Date: 09/06/2019 CLINICAL DATA:  Hypoxemia EXAM: CT ANGIOGRAPHY CHEST WITH CONTRAST TECHNIQUE: Multidetector CT imaging of the chest was performed using the standard protocol during bolus administration of intravenous contrast. Multiplanar CT image reconstructions and MIPs were obtained to evaluate the vascular anatomy. CONTRAST:  30mL OMNIPAQUE IOHEXOL 350 MG/ML SOLN COMPARISON:  None. FINDINGS: Cardiovascular: Satisfactory opacification of the pulmonary arteries to the segmental level. No evidence of pulmonary embolism. Normal heart size. No pericardial effusion. Minor calcified plaque along the aortic arch.  Mediastinum/Nodes: There are no enlarged mediastinal or hilar lymph nodes. No axillary adenopathy. Thyroid is unremarkable Lungs/Pleura: Right pleural effusion tracking into the oblique fissure. Small left pleural effusion. No pneumothorax. Bibasilar atelectasis/consolidation. Additional patchy opacity bilaterally. Upper Abdomen: Partially imaged diffuse thickening of the left adrenal. No acute abnormality. Musculoskeletal: Likely chronic loss of thoracic vertebral body height at multiple levels. Review of the MIP images confirms the above findings. IMPRESSION: No evidence of acute pulmonary embolism. Right greater than left pleural effusions. Bibasilar atelectasis and consolidation. Additional patchy opacities. May reflect multifocal pneumonia with some superimposed edema. Electronically Signed   By: Guadlupe Spanish M.D.   On: 09/06/2019 13:31   DG Chest Port 1 View  Result Date: 09/11/2019 CLINICAL DATA:  Cough, fever EXAM: PORTABLE CHEST 1 VIEW COMPARISON:  09/01/2013 FINDINGS: Single frontal view of the chest demonstrates a stable cardiac silhouette. There are multiple prior healed left rib fractures. Linear consolidation at the left lung base likely reflects scarring. No airspace disease, effusion, or pneumothorax. IMPRESSION: 1. No acute intrathoracic process. Electronically Signed   By: Sharlet Salina M.D.   On: 09/07/2019 16:38    Cardiac Studies   Echo pending  Patient Profile     64 y.o. male with PMH type II diabetes, tobacco abuse who presented with severe shortness of breath, malaise, myalgias. Admitted for pneumonia, sepsis, hypoxic respiratory failure. Cardiology consulted for atrial fibrillation with RVR  Assessment & Plan    Atrial fibrillation with RVR: -new, likely triggered by severe pulmonary infection -difficult to rate control, driven by his respiratory status -started on amiodarone overnight. Remains on diltiazem -with elevated LFTs, would try to stop amiodarone if his  rate improves -once respiratory status stabilized, may be able to add beta blocker instead if he is not hypotensive -echo pending -I suspect with intubation and sedation his heart rate would improve significantly -holding anticoagulation given thrombocytopenia. Long term, chadsvasc=1 (pending echo)  Sepsis, acute hypoxic respiratory failure, community acquire bacterial pneumonia, pulmonary edema, lactic acidosis, : -per PCCM -pending intubation this morning  At bedside with critical care, severe progression of illness, pending intubation shortly.  For questions or updates, please contact [facility] Please consult [email or web address] for contact info under     Signed, Jodelle Red, MD  09/07/2019, 10:00 AM

## 2019-09-07 NOTE — Procedures (Signed)
Cortrak  Person Inserting Tube:  Kendell Bane C, RD Tube Type:  Cortrak - 43 inches Tube Location:  Left nare Initial Placement:  Postpyloric Secured by: Bridle Technique Used to Measure Tube Placement:  Documented cm marking at nare/ corner of mouth Cortrak Secured At:  84 cm    Cortrak Tube Team Note:  Consult received to place a Cortrak feeding tube.   No x-ray is required. RN may begin using tube.   If the tube becomes dislodged please keep the tube and contact the Cortrak team at [email or web address] (password [email or web address]) for replacement.  If after hours and replacement cannot be delayed, place a NG tube and confirm placement with an abdominal x-ray.    Cammy Copa., RD, LDN, CNSC See AMiON for contact information

## 2019-09-07 NOTE — Progress Notes (Addendum)
Initial Nutrition Assessment  DOCUMENTATION CODES:   Not applicable  INTERVENTION:  Initiate TF via Cortrak NGT with Vital High Protein at goal rate of 65 ml/h (1560 ml per day) and Prostat 30 ml once daily   Tube feeding regimen with current propofol rate to provide 2134 kcals, 152 gm protein, 1310 ml free water daily.  NUTRITION DIAGNOSIS:   Inadequate oral intake related to inability to eat as evidenced by NPO status.  GOAL:   Patient will meet greater than or equal to 90% of their needs  MONITOR:   Vent status, Skin, TF tolerance, Weight trends, Labs, I & O's  REASON FOR ASSESSMENT:   Consult Enteral/tube feeding initiation and management  ASSESSMENT:   64 y.o. male with PMH type II diabetes, tobacco abuse who presented with severe shortness of breath, malaise, myalgias. Admitted for pneumonia, sepsis, hypoxic respiratory failure.   Patient is currently intubated on ventilator support MV: 13.5 L/min Temp (24hrs), Avg:98.7 F (37.1 C), Min:97.9 F (36.6 C), Max:100.1 F (37.8 C)  Propofol: 17.96 ml/hr which provides 474 kcal/day.   Cortrak NGT placed today. Tip of tube postpyloric. Labs and medications reviewed. RD to order tube feeding.   NUTRITION - FOCUSED PHYSICAL EXAM:    Most Recent Value  Orbital Region  No depletion  Upper Arm Region  No depletion  Thoracic and Lumbar Region  No depletion  Buccal Region  Unable to assess  Temple Region  Mild depletion  Clavicle Bone Region  No depletion  Clavicle and Acromion Bone Region  No depletion  Scapular Bone Region  Unable to assess  Dorsal Hand  Unable to assess  Patellar Region  No depletion  Anterior Thigh Region  No depletion  Posterior Calf Region  No depletion  Edema (RD Assessment)  None  Hair  Reviewed  Eyes  Unable to assess  Mouth  Reviewed  Skin  Reviewed  Nails  Unable to assess       Diet Order:   Diet Order            Diet NPO time specified  Diet effective now               EDUCATION NEEDS:   Not appropriate for education at this time  Skin:  Skin Assessment: Reviewed RN Assessment  Last BM:  Unknown  Height:   Ht Readings from Last 1 Encounters:  08/23/2019 6\' 1"  (1.854 m)    Weight:   Wt Readings from Last 1 Encounters:  09/07/2019 99.8 kg    BMI:  Body mass index is 29.03 kg/m.  Estimated Nutritional Needs:   Kcal:  2174  Protein:  130-150 grams  Fluid:  >/= 2 L/day   2175, MS, RD, LDN RD pager number/after hours weekend pager number on Amion.

## 2019-09-07 NOTE — Progress Notes (Signed)
  Echocardiogram 2D Echocardiogram has been performed.  Derek [name] 09/07/2019, 3:39 PM

## 2019-09-07 NOTE — Procedures (Signed)
Central Venous Catheter Insertion Procedure Note Zacchary Pompei [id number] 1955-12-09  Procedure: Insertion of Central Venous Catheter Indications: Assessment of intravascular volume, Drug and/or fluid administration and Frequent blood sampling  Procedure Details Consent: Risks of procedure as well as the alternatives and risks of each were explained to the (patient/caregiver).  Consent for procedure obtained. Time Out: Verified patient identification, verified procedure, site/side was marked, verified correct patient position, special equipment/implants available, medications/allergies/relevent history reviewed, required imaging and test results available.  Performed  Maximum sterile technique was used including antiseptics, cap, gloves, gown, hand hygiene, mask and sheet. Skin prep: Chlorhexidine; local anesthetic administered A antimicrobial bonded/coated triple lumen catheter was placed in the right internal jugular vein using the Seldinger technique.  Evaluation Blood flow good Complications: No apparent complications Patient did tolerate procedure well. Chest X-ray ordered to verify placement.  CXR: pending   Right IJ CVC placed with direct vessel visualization under ultrasound., and under the direct supervision of Dr. Craige Cotta. Good Blood Return all ports. Done as a shared procedure with Anders Simmonds NP.  Bevelyn Ngo, MSN, AGACNP-BC [facility] Pulmonary/Critical Care Medicine Pager # [phone] After 4 pm please call [phone].  Bevelyn Ngo 09/07/2019, 10:51 AM

## 2019-09-07 NOTE — Progress Notes (Signed)
Patients BP still 82/55 after max dose of Neo and started on vasopressin.  Contacted MD and started on a 1 liter dose of NS bolus.

## 2019-09-08 ENCOUNTER — Inpatient Hospital Stay (HOSPITAL_COMMUNITY): Payer: Self-pay

## 2019-09-08 DIAGNOSIS — R6521 Severe sepsis with septic shock: Secondary | ICD-10-CM

## 2019-09-08 DIAGNOSIS — I469 Cardiac arrest, cause unspecified: Secondary | ICD-10-CM

## 2019-09-08 DIAGNOSIS — N170 Acute kidney failure with tubular necrosis: Secondary | ICD-10-CM

## 2019-09-08 DIAGNOSIS — I509 Heart failure, unspecified: Secondary | ICD-10-CM

## 2019-09-08 DIAGNOSIS — A403 Sepsis due to Streptococcus pneumoniae: Secondary | ICD-10-CM

## 2019-09-08 DIAGNOSIS — D65 Disseminated intravascular coagulation [defibrination syndrome]: Secondary | ICD-10-CM

## 2019-09-08 LAB — POCT I-STAT 7, (LYTES, BLD GAS, ICA,H+H)
Acid-Base Excess: 4 mmol/L — ABNORMAL HIGH (ref 0.0–2.0)
Acid-base deficit: 11 mmol/L — ABNORMAL HIGH (ref 0.0–2.0)
Acid-base deficit: 7 mmol/L — ABNORMAL HIGH (ref 0.0–2.0)
Acid-base deficit: 14 mmol/L — ABNORMAL HIGH (ref 0.0–2.0)
Bicarbonate: 16 mmol/L — ABNORMAL LOW (ref 20.0–28.0)
Bicarbonate: 18 mmol/L — ABNORMAL LOW (ref 20.0–28.0)
Bicarbonate: 14.6 mmol/L — ABNORMAL LOW (ref 20.0–28.0)
Bicarbonate: 30.4 mmol/L — ABNORMAL HIGH (ref 20.0–28.0)
Calcium, Ion: 1 mmol/L — ABNORMAL LOW (ref 1.15–1.40)
Calcium, Ion: 1.01 mmol/L — ABNORMAL LOW (ref 1.15–1.40)
Calcium, Ion: 0.94 mmol/L — ABNORMAL LOW (ref 1.15–1.40)
Calcium, Ion: 0.76 mmol/L — CL (ref 1.15–1.40)
HCT: 32 % — ABNORMAL LOW (ref 39.0–52.0)
HCT: 32 % — ABNORMAL LOW (ref 39.0–52.0)
HCT: 30 % — ABNORMAL LOW (ref 39.0–52.0)
HCT: 23 % — ABNORMAL LOW (ref 39.0–52.0)
Hemoglobin: 10.9 g/dL — ABNORMAL LOW (ref 13.0–17.0)
Hemoglobin: 10.9 g/dL — ABNORMAL LOW (ref 13.0–17.0)
Hemoglobin: 10.2 g/dL — ABNORMAL LOW (ref 13.0–17.0)
Hemoglobin: 7.8 g/dL — ABNORMAL LOW (ref 13.0–17.0)
O2 Saturation: 83 %
O2 Saturation: 97 %
O2 Saturation: 96 %
O2 Saturation: 83 %
Patient temperature: 100.8
Patient temperature: 100.8
Patient temperature: 99.1
Potassium: 3.8 mmol/L (ref 3.5–5.1)
Potassium: 4 mmol/L (ref 3.5–5.1)
Potassium: 4.4 mmol/L (ref 3.5–5.1)
Potassium: 5.6 mmol/L — ABNORMAL HIGH (ref 3.5–5.1)
Sodium: 134 mmol/L — ABNORMAL LOW (ref 135–145)
Sodium: 135 mmol/L (ref 135–145)
Sodium: 136 mmol/L (ref 135–145)
Sodium: 148 mmol/L — ABNORMAL HIGH (ref 135–145)
TCO2: 17 mmol/L — ABNORMAL LOW (ref 22–32)
TCO2: 19 mmol/L — ABNORMAL LOW (ref 22–32)
TCO2: 16 mmol/L — ABNORMAL LOW (ref 22–32)
TCO2: 32 mmol/L (ref 22–32)
pCO2 arterial: 36.6 mmHg (ref 32.0–48.0)
pCO2 arterial: 42.4 mmHg (ref 32.0–48.0)
pCO2 arterial: 44.2 mmHg (ref 32.0–48.0)
pCO2 arterial: 57.9 mmHg — ABNORMAL HIGH (ref 32.0–48.0)
pH, Arterial: 7.192 — CL (ref 7.350–7.450)
pH, Arterial: 7.305 — ABNORMAL LOW (ref 7.350–7.450)
pH, Arterial: 7.128 — CL (ref 7.350–7.450)
pH, Arterial: 7.328 — ABNORMAL LOW (ref 7.350–7.450)
pO2, Arterial: 116 mmHg — ABNORMAL HIGH (ref 83.0–108.0)
pO2, Arterial: 55 mmHg — ABNORMAL LOW (ref 83.0–108.0)
pO2, Arterial: 112 mmHg — ABNORMAL HIGH (ref 83.0–108.0)
pO2, Arterial: 52 mmHg — ABNORMAL LOW (ref 83.0–108.0)

## 2019-09-08 LAB — COMPREHENSIVE METABOLIC PANEL
ALT: 1235 U/L — ABNORMAL HIGH (ref 0–44)
AST: 5566 U/L — ABNORMAL HIGH (ref 15–41)
Albumin: 2.1 g/dL — ABNORMAL LOW (ref 3.5–5.0)
Alkaline Phosphatase: 138 U/L — ABNORMAL HIGH (ref 38–126)
Anion gap: 23 — ABNORMAL HIGH (ref 5–15)
BUN: 45 mg/dL — ABNORMAL HIGH (ref 8–23)
CO2: 15 mmol/L — ABNORMAL LOW (ref 22–32)
Calcium: 7 mg/dL — ABNORMAL LOW (ref 8.9–10.3)
Chloride: 99 mmol/L (ref 98–111)
Creatinine, Ser: 1.96 mg/dL — ABNORMAL HIGH (ref 0.61–1.24)
GFR calc Af Amer: 41 mL/min — ABNORMAL LOW (ref >60)
GFR calc non Af Amer: 35 mL/min — ABNORMAL LOW (ref >60)
Glucose, Bld: 288 mg/dL — ABNORMAL HIGH (ref 70–99)
Potassium: 4.7 mmol/L (ref 3.5–5.1)
Sodium: 137 mmol/L (ref 135–145)
Total Bilirubin: 1.9 mg/dL — ABNORMAL HIGH (ref 0.3–1.2)
Total Protein: 5 g/dL — ABNORMAL LOW (ref 6.5–8.1)

## 2019-09-08 LAB — RENAL FUNCTION PANEL
Albumin: 2.1 g/dL — ABNORMAL LOW (ref 3.5–5.0)
Anion gap: 28 — ABNORMAL HIGH (ref 5–15)
BUN: 45 mg/dL — ABNORMAL HIGH (ref 8–23)
CO2: 9 mmol/L — ABNORMAL LOW (ref 22–32)
Calcium: 6.8 mg/dL — ABNORMAL LOW (ref 8.9–10.3)
Chloride: 103 mmol/L (ref 98–111)
Creatinine, Ser: 2.64 mg/dL — ABNORMAL HIGH (ref 0.61–1.24)
GFR calc Af Amer: 28 mL/min — ABNORMAL LOW (ref >60)
GFR calc non Af Amer: 24 mL/min — ABNORMAL LOW (ref >60)
Glucose, Bld: 245 mg/dL — ABNORMAL HIGH (ref 70–99)
Phosphorus: 10.5 mg/dL — ABNORMAL HIGH (ref 2.5–4.6)
Potassium: 5.3 mmol/L — ABNORMAL HIGH (ref 3.5–5.1)
Sodium: 140 mmol/L (ref 135–145)

## 2019-09-08 LAB — LACTIC ACID, PLASMA
Lactic Acid, Venous: >11 mmol/L (ref 0.5–1.9)
Lactic Acid, Venous: >11 mmol/L (ref 0.5–1.9)

## 2019-09-08 LAB — CBC
HCT: 32.1 % — ABNORMAL LOW (ref 39.0–52.0)
Hemoglobin: 10.1 g/dL — ABNORMAL LOW (ref 13.0–17.0)
MCH: 32.6 pg (ref 26.0–34.0)
MCHC: 31.5 g/dL (ref 30.0–36.0)
MCV: 103.5 fL — ABNORMAL HIGH (ref 80.0–100.0)
Platelets: 30 10*3/uL — ABNORMAL LOW (ref 150–400)
RBC: 3.1 MIL/uL — ABNORMAL LOW (ref 4.22–5.81)
RDW: 15.3 % (ref 11.5–15.5)
WBC: 41.2 10*3/uL — ABNORMAL HIGH (ref 4.0–10.5)
nRBC: 2.5 % — ABNORMAL HIGH (ref 0.0–0.2)

## 2019-09-08 LAB — GLUCOSE, CAPILLARY
Glucose-Capillary: 246 mg/dL — ABNORMAL HIGH (ref 70–99)
Glucose-Capillary: 255 mg/dL — ABNORMAL HIGH (ref 70–99)
Glucose-Capillary: 244 mg/dL — ABNORMAL HIGH (ref 70–99)
Glucose-Capillary: 221 mg/dL — ABNORMAL HIGH (ref 70–99)
Glucose-Capillary: 129 mg/dL — ABNORMAL HIGH (ref 70–99)
Glucose-Capillary: 189 mg/dL — ABNORMAL HIGH (ref 70–99)

## 2019-09-08 LAB — TRIGLYCERIDES: Triglycerides: 113 mg/dL (ref <150)

## 2019-09-08 LAB — URINE CULTURE: Culture: NO GROWTH

## 2019-09-08 LAB — MAGNESIUM: Magnesium: 2.5 mg/dL — ABNORMAL HIGH (ref 1.7–2.4)

## 2019-09-08 LAB — PHOSPHORUS: Phosphorus: 6.1 mg/dL — ABNORMAL HIGH (ref 2.5–4.6)

## 2019-09-08 MED ORDER — VANCOMYCIN HCL IN DEXTROSE 1-5 GM/200ML-% IV SOLN: 1000.0000 mg | INTRAVENOUS | Status: DC

## 2019-09-08 MED ORDER — ALBUMIN HUMAN 5 % IV SOLN
INTRAVENOUS | Status: AC
  Filled 2019-09-08: qty 500

## 2019-09-08 MED ORDER — SODIUM CHLORIDE 0.9 % IV SOLN
1.2500 ng/kg/min | INTRAVENOUS | Status: DC
  Administered 2019-09-08: 5 ng/kg/min via INTRAVENOUS
  Filled 2019-09-08: qty 1

## 2019-09-08 MED ORDER — NOREPINEPHRINE 16 MG/250ML-% IV SOLN
0.0000 ug/min | INTRAVENOUS | Status: DC
  Administered 2019-09-08 (×2): 100 ug/min via INTRAVENOUS
  Filled 2019-09-08: qty 500

## 2019-09-08 MED ORDER — ALBUMIN HUMAN 5 % IV SOLN
INTRAVENOUS | Status: AC
  Filled 2019-09-08: qty 250

## 2019-09-08 MED ORDER — LACTATED RINGERS IV BOLUS
500.0000 mL | Freq: Once | INTRAVENOUS | Status: AC
  Administered 2019-09-08: 500 mL via INTRAVENOUS

## 2019-09-08 MED ORDER — STERILE WATER FOR INJECTION IV SOLN
INTRAVENOUS | Status: DC
  Filled 2019-09-08 (×4): qty 150

## 2019-09-08 MED ORDER — VANCOMYCIN VARIABLE DOSE PER UNSTABLE RENAL FUNCTION (PHARMACIST DOSING): Status: DC

## 2019-09-08 MED ORDER — SODIUM CHLORIDE 0.9 % IV SOLN
1.0000 g | Freq: Once | INTRAVENOUS | Status: AC
  Administered 2019-09-08: 1 g via INTRAVENOUS
  Filled 2019-09-08: qty 10

## 2019-09-08 MED ORDER — HEPARIN BOLUS VIA INFUSION (CRRT)
1000.0000 [IU] | INTRAVENOUS | Status: DC | PRN
  Filled 2019-09-08: qty 1000

## 2019-09-08 MED ORDER — SODIUM BICARBONATE-DEXTROSE 150-5 MEQ/L-% IV SOLN: 150.0000 meq | INTRAVENOUS | Status: DC

## 2019-09-08 MED ORDER — SODIUM BICARBONATE 8.4 % IV SOLN
INTRAVENOUS | Status: AC
  Filled 2019-09-08: qty 100

## 2019-09-08 MED ORDER — STERILE WATER FOR INJECTION IV SOLN
INTRAVENOUS | Status: DC
  Filled 2019-09-08 (×3): qty 150

## 2019-09-08 MED ORDER — SODIUM BICARBONATE 8.4 % IV SOLN
50.0000 meq | Freq: Once | INTRAVENOUS | Status: AC
  Administered 2019-09-08: 50 meq via INTRAVENOUS

## 2019-09-08 MED ORDER — SODIUM CHLORIDE 0.9 % IV SOLN
250.0000 [IU]/h | INTRAVENOUS | Status: DC
  Administered 2019-09-08: 250 [IU]/h via INTRAVENOUS_CENTRAL
  Filled 2019-09-08: qty 2

## 2019-09-08 MED ORDER — ALBUMIN HUMAN 5 % IV SOLN
INTRAVENOUS | Status: AC
  Administered 2019-09-08: 12.5 g
  Filled 2019-09-08: qty 250

## 2019-09-08 MED ORDER — EPINEPHRINE 1 MG/10ML IJ SOSY
PREFILLED_SYRINGE | INTRAMUSCULAR | Status: AC
  Filled 2019-09-08: qty 10

## 2019-09-08 MED ORDER — HYDROCORTISONE NA SUCCINATE PF 100 MG IJ SOLR
50.0000 mg | Freq: Four times a day (QID) | INTRAMUSCULAR | Status: DC
  Administered 2019-09-08 (×2): 50 mg via INTRAVENOUS

## 2019-09-08 MED ORDER — SODIUM BICARBONATE 8.4 % IV SOLN
50.0000 meq | Freq: Once | INTRAVENOUS | Status: AC
  Administered 2019-09-08: 50 meq via INTRAVENOUS
  Filled 2019-09-08 (×2): qty 50

## 2019-09-08 MED ORDER — SODIUM BICARBONATE 8.4 % IV SOLN
INTRAVENOUS | Status: AC
  Filled 2019-09-08: qty 50

## 2019-09-08 MED ORDER — PRISMASOL BGK 4/2.5 32-4-2.5 MEQ/L IV SOLN
INTRAVENOUS | Status: DC
  Filled 2019-09-08 (×8): qty 5000

## 2019-09-08 MED ORDER — ALBUMIN HUMAN 5 % IV SOLN
25.0000 g | Freq: Once | INTRAVENOUS | Status: AC
  Administered 2019-09-08: 25 g via INTRAVENOUS

## 2019-09-08 MED ORDER — SODIUM BICARBONATE 8.4 % IV SOLN
INTRAVENOUS | Status: DC
  Filled 2019-09-08 (×3): qty 150

## 2019-09-08 MED ORDER — HEPARIN SODIUM (PORCINE) 5000 UNIT/ML IJ SOLN
INTRAMUSCULAR | Status: AC
  Filled 2019-09-08: qty 3

## 2019-09-08 MED ORDER — VANCOMYCIN HCL 2000 MG/400ML IV SOLN
2000.0000 mg | Freq: Once | INTRAVENOUS | Status: AC
  Administered 2019-09-08: 2000 mg via INTRAVENOUS
  Filled 2019-09-08 (×2): qty 400

## 2019-09-08 MED ORDER — HEPARIN (PORCINE) 2000 UNITS/L FOR CRRT
INTRAVENOUS_CENTRAL | Status: DC | PRN
  Filled 2019-09-08: qty 1000

## 2019-09-08 MED ORDER — HEPARIN SODIUM (PORCINE) 1000 UNIT/ML DIALYSIS
1000.0000 [IU] | INTRAMUSCULAR | Status: DC | PRN
  Administered 2019-09-08: 2000 [IU] via INTRAVENOUS_CENTRAL
  Filled 2019-09-08 (×3): qty 6
  Filled 2019-09-08: qty 2

## 2019-09-08 MED ORDER — SODIUM BICARBONATE 8.4 % IV SOLN
100.0000 meq | Freq: Once | INTRAVENOUS | Status: AC
  Administered 2019-09-08: 100 meq via INTRAVENOUS
  Filled 2019-09-08: qty 100

## 2019-09-08 MED ORDER — SODIUM CHLORIDE 0.9 % IV SOLN
2.0000 g | Freq: Two times a day (BID) | INTRAVENOUS | Status: DC
  Administered 2019-09-08: 2 g via INTRAVENOUS
  Filled 2019-09-08 (×2): qty 2

## 2019-09-08 MED ORDER — ALBUMIN HUMAN 25 % IV SOLN
25.0000 g | Freq: Four times a day (QID) | INTRAVENOUS | Status: DC
  Administered 2019-09-08: 25 g via INTRAVENOUS

## 2019-09-08 MED ORDER — SODIUM CHLORIDE 0.9 % IV SOLN
2.0000 g | Freq: Once | INTRAVENOUS | Status: DC
  Filled 2019-09-08: qty 2

## 2019-09-09 LAB — POCT I-STAT EG7
Acid-base deficit: 16 mmol/L — ABNORMAL HIGH (ref 0.0–2.0)
Bicarbonate: 11.8 mmol/L — ABNORMAL LOW (ref 20.0–28.0)
Calcium, Ion: 0.83 mmol/L — CL (ref 1.15–1.40)
HCT: 28 % — ABNORMAL LOW (ref 39.0–52.0)
Hemoglobin: 9.5 g/dL — ABNORMAL LOW (ref 13.0–17.0)
O2 Saturation: 92 %
Potassium: 6.4 mmol/L (ref 3.5–5.1)
Sodium: 135 mmol/L (ref 135–145)
TCO2: 13 mmol/L — ABNORMAL LOW (ref 22–32)
pCO2, Ven: 33.6 mmHg — ABNORMAL LOW (ref 44.0–60.0)
pH, Ven: 7.153 — CL (ref 7.250–7.430)
pO2, Ven: 81 mmHg — ABNORMAL HIGH (ref 32.0–45.0)

## 2019-09-09 LAB — CULTURE, RESPIRATORY W GRAM STAIN
Culture: NO GROWTH
Gram Stain: NONE SEEN

## 2019-09-09 LAB — POCT ACTIVATED CLOTTING TIME
Activated Clotting Time: 208 seconds
Activated Clotting Time: 213 seconds

## 2019-09-09 MED FILL — Medication: Qty: 1 | Status: AC

## 2019-09-10 LAB — CULTURE, BLOOD (ROUTINE X 2)
Culture: NO GROWTH
Culture: NO GROWTH

## 2019-09-10 LAB — LEGIONELLA PNEUMOPHILA SEROGP 1 UR AG: L. pneumophila Serogp 1 Ur Ag: NEGATIVE

## 2019-09-13 NOTE — Progress Notes (Signed)
eLink Physician-Brief Progress Note Patient Name: Derek [name] DOB: Aug 26, 1955 MRN: [id number]   Date of Service  08/29/2019  HPI/Events of Note  ABG: 7.19/ 42.4/ 116/ 16/ 97 % Lactate 10.3  eICU Interventions  Sodium bicarbonate 1 amp iv now. Albumin 5 % 500 ml iv x 1     Intervention Category Major Interventions: Acid-Base disturbance - evaluation and management  Arrietty Dercole U Ambermarie Honeyman 08/23/2019, 2:10 AM

## 2019-09-13 NOTE — Procedures (Signed)
Hemodialysis Catheter Insertion Procedure Note Derek [name] [id number] 29-May-1956  Procedure: Insertion of Hemodialysis Catheter Indications: Dialysis Access   Procedure Details Consent: Risks of procedure as well as the alternatives and risks of each were explained to the (patient/caregiver).  Consent for procedure obtained. Time Out: Verified patient identification, verified procedure, site/side was marked, verified correct patient position, special equipment/implants available, medications/allergies/relevent history reviewed, required imaging and test results available.  Performed  Maximum sterile technique was used including antiseptics, cap, gloves, gown, hand hygiene, mask and sheet. Skin prep: Chlorhexidine; local anesthetic administered Triple lumen hemodialysis catheter was inserted into left femoral vein using the Seldinger technique.  Evaluation Blood flow good Complications: No apparent complications Patient did tolerate procedure well. Chest X-ray ordered to verify placement.  CXR: not needed.   Lorin Glass 09/04/2019

## 2019-09-13 NOTE — Progress Notes (Signed)
CRITICAL VALUE ALERT  Critical Value:  Lactic acid >11  Date & Time Notied:  12-Sep-2019 0700  Provider Notified: eLink  Orders Received/Actions taken: LR bolus, 5% albumin bolus

## 2019-09-13 NOTE — Death Summary Note (Signed)
Duplicate note entered in error.  Darcella Gasman Ethyle Tiedt, PA-C

## 2019-09-13 NOTE — Consult Note (Signed)
Reason for Consult: Acute kidney injury, metabolic acidosis Referring Physician: Ina Homes, MD  HPI:  [name] admitted to the hospital 3 days ago with reports of nausea, myalgias, fever cough and shortness of breath and consequently found to have hypoxia with pneumonia and concomitant atrial fibrillation with rapid ventricular response.  Baseline creatinine appears to be around 0.8-1.0.  Over the last 24 to 48 hours, he has had increasing hemodynamic instability, pressor requirements and respiratory failure prompting intubation.  Labs are concerning for worsening metabolic acidosis with worsening renal function.  Medical management attempted but unsuccessful in rectifying acidemia.  Family would like to pursue all measures of aggressive care.  He had a CT angiogram on 3/25 to rule out PE.  Past Medical History:  Diagnosis Date  . Diabetes mellitus ([place])   . Tobacco abuse   . Traumatic pneumothorax 2015    Past Surgical History:  Procedure Laterality Date  . CHEST TUBE INSERTION      Family History  Problem Relation Age of Onset  . Heart attack Paternal Grandfather        In his 31s    Social History:  reports that he has been smoking cigarettes. He has a 55.00 pack-year smoking history. He has never used smokeless tobacco. He reports that he does not drink alcohol or use drugs.  Allergies: No Known Allergies  Medications:  Scheduled: . chlorhexidine gluconate (MEDLINE KIT)  15 mL Mouth Rinse BID  . Chlorhexidine Gluconate Cloth  6 each Topical Daily  . famotidine  20 mg Per Tube Q12H  . feeding supplement (PRO-STAT SUGAR FREE 64)  30 mL Per Tube Daily  . hydrocortisone sod succinate (SOLU-CORTEF) inj  50 mg Intravenous Q6H  . influenza vac split quadrivalent PF  0.5 mL Intramuscular Tomorrow-1000  . insulin aspart  0-20 Units Subcutaneous Q4H  . insulin glargine  10 Units Subcutaneous BID  . mouth rinse  15 mL Mouth Rinse 10 times per day  . pneumococcal  23 valent vaccine  0.5 mL Intramuscular Tomorrow-1000  . sodium chloride flush  3 mL Intravenous Q12H  . vancomycin variable dose per unstable renal function (pharmacist dosing)   Does not apply See admin instructions    BMP Latest Ref Rng & Units 2019/09/12 09/12/2019 Sep 12, 2019  Glucose 70 - 99 mg/dL - 288(H) -  BUN 8 - 23 mg/dL - 45(H) -  Creatinine 0.61 - 1.24 mg/dL - 1.96(H) -  Sodium 135 - 145 mmol/L 136 137 134(L)  Potassium 3.5 - 5.1 mmol/L 4.4 4.7 4.0  Chloride 98 - 111 mmol/L - 99 -  CO2 22 - 32 mmol/L - 15(L) -  Calcium 8.9 - 10.3 mg/dL - 7.0(L) -   CBC Latest Ref Rng & Units 2019-09-12 12-Sep-2019 2019/09/12  WBC 4.0 - 10.5 K/uL - 41.2(H) -  Hemoglobin 13.0 - 17.0 g/dL 10.2(L) 10.1(L) 10.9(L)  Hematocrit 39.0 - 52.0 % 30.0(L) 32.1(L) 32.0(L)  Platelets 150 - 400 K/uL - 30(L) -   CT ANGIO CHEST PE W OR WO CONTRAST  Result Date: 09/06/2019 CLINICAL DATA:  Hypoxemia EXAM: CT ANGIOGRAPHY CHEST WITH CONTRAST TECHNIQUE: Multidetector CT imaging of the chest was performed using the standard protocol during bolus administration of intravenous contrast. Multiplanar CT image reconstructions and MIPs were obtained to evaluate the vascular anatomy. CONTRAST:  58m OMNIPAQUE IOHEXOL 350 MG/ML SOLN COMPARISON:  None. FINDINGS: Cardiovascular: Satisfactory opacification of the pulmonary arteries to the segmental level. No evidence of pulmonary embolism. Normal heart size. No pericardial effusion.  Minor calcified plaque along the aortic arch. Mediastinum/Nodes: There are no enlarged mediastinal or hilar lymph nodes. No axillary adenopathy. Thyroid is unremarkable Lungs/Pleura: Right pleural effusion tracking into the oblique fissure. Small left pleural effusion. No pneumothorax. Bibasilar atelectasis/consolidation. Additional patchy opacity bilaterally. Upper Abdomen: Partially imaged diffuse thickening of the left adrenal. No acute abnormality. Musculoskeletal: Likely chronic loss of thoracic vertebral  body height at multiple levels. Review of the MIP images confirms the above findings. IMPRESSION: No evidence of acute pulmonary embolism. Right greater than left pleural effusions. Bibasilar atelectasis and consolidation. Additional patchy opacities. May reflect multifocal pneumonia with some superimposed edema. Electronically Signed   By: Macy Mis M.D.   On: 09/06/2019 13:31   DG Chest Port 1 View  Result Date: September 14, 2019 CLINICAL DATA:  Respiratory failure EXAM: PORTABLE CHEST 1 VIEW COMPARISON:  09/07/2019 FINDINGS: Endotracheal tube with the tip 3.8 cm above the carina. Nasogastric tube coursing below the diaphragm. Right jugular central venous catheter projecting over the cavoatrial junction. Bilateral interstitial thickening with patchy alveolar airspace opacities more focal in the upper lobes. No definite pleural effusion. No pneumothorax. Redemonstrated left posterior rib fractures. IMPRESSION: Support lines and tubing in satisfactory position. Bilateral interstitial thickening with patchy alveolar airspace opacities more focal in the upper lobes concerning for pneumonia. Electronically Signed   By: Kathreen Devoid   On: September 14, 2019 08:11   Portable Chest x-ray  Result Date: 09/07/2019 CLINICAL DATA:  Endotracheal tube placement. Central line placement. EXAM: PORTABLE CHEST 1 VIEW COMPARISON:  09/03/2019 FINDINGS: Endotracheal tube has been placed and positioned 4.3 cm above the carina. Right jugular central line tip is at the SVC and right atrium junction. Nasogastric tube tip is in the proximal stomach with the sidehole in the distal esophagus. There are extensive new airspace densities in the right upper lung. There may also be new patchy airspace densities in the left upper lung and left suprahilar region. Heart size is stable. Negative for pneumothorax. Again noted are old left rib fractures. IMPRESSION: 1. Significant change in the lungs from the previous examination. Large amount of  airspace disease in the right upper lung and suspect patchy airspace disease in the left upper lung. 2. Endotracheal tube and right jugular central line are well positioned. Negative for a pneumothorax. 3. Nasogastric tube tip is in the proximal stomach. Electronically Signed   By: Markus Daft M.D.   On: 09/07/2019 11:28   DG Abd Portable 1V  Result Date: 09/07/2019 CLINICAL DATA:  NG tube placement EXAM: PORTABLE ABDOMEN - 1 VIEW COMPARISON:  None. FINDINGS: Nasogastric tube with the tip at the esophagogastric junction. Recommend advancing the nasogastric tube 14 cm. There is no bowel dilatation to suggest obstruction. There is no evidence of pneumoperitoneum, portal venous gas or pneumatosis. There are no pathologic calcifications along the expected course of the ureters. The osseous structures are unremarkable. IMPRESSION: Nasogastric tube with the tip at the esophagogastric junction. Recommend advancing the nasogastric tube 14 cm. Electronically Signed   By: Kathreen Devoid   On: 09/07/2019 11:25   ECHOCARDIOGRAM COMPLETE  Result Date: 09/07/2019    ECHOCARDIOGRAM REPORT   Patient Name:   Derek [name] Date of Exam: 09/07/2019 Medical Rec #:  [id number]    Height:       73.0 in Accession #:    [id number]   Weight:       220.0 lb Date of Birth:  1955/11/01   BSA:          2.241 m  Patient Age:    53 years     BP:           86/61 mmHg Patient Gender: M            HR:           76 bpm. Exam Location:  Inpatient Procedure: 2D Echo Indications:    atrial fibrillation 427.31  History:        Patient has no prior history of Echocardiogram examinations.                 Sepsis, Arrythmias:Atrial Fibrillation; Risk Factors:Diabetes.  Sonographer:    Johny Chess Referring Phys: [place]  1. Left ventricular ejection fraction, by estimation, is 40 to 45%. The left ventricle has mildly decreased function. The left ventricle demonstrates global hypokinesis. Left ventricular diastolic parameters  are consistent with Grade I diastolic dysfunction (impaired relaxation).  2. Right ventricular systolic function is mildly reduced. The right ventricular size is normal.  3. The mitral valve is grossly normal. Mild mitral valve regurgitation. No evidence of mitral stenosis.  4. The aortic valve is tricuspid. Aortic valve regurgitation is not visualized. No aortic stenosis is present. FINDINGS  Left Ventricle: Left ventricular ejection fraction, by estimation, is 40 to 45%. The left ventricle has mildly decreased function. The left ventricle demonstrates global hypokinesis. The left ventricular internal cavity size was normal in size. There is  no left ventricular hypertrophy. Left ventricular diastolic parameters are consistent with Grade I diastolic dysfunction (impaired relaxation). Normal left ventricular filling pressure. Right Ventricle: The right ventricular size is normal. No increase in right ventricular wall thickness. Right ventricular systolic function is mildly reduced. Left Atrium: Left atrial size was normal in size. Right Atrium: Right atrial size was normal in size. Pericardium: There is no evidence of pericardial effusion. Mitral Valve: The mitral valve is grossly normal. Mild mitral valve regurgitation. No evidence of mitral valve stenosis. Tricuspid Valve: The tricuspid valve is grossly normal. Tricuspid valve regurgitation is mild . No evidence of tricuspid stenosis. Aortic Valve: The aortic valve is tricuspid. Aortic valve regurgitation is not visualized. No aortic stenosis is present. Pulmonic Valve: The pulmonic valve was grossly normal. Pulmonic valve regurgitation is not visualized. No evidence of pulmonic stenosis. Aorta: The aortic root is normal in size and structure. Venous: IVC assessment for right atrial pressure unable to be performed due to mechanical ventilation. IAS/Shunts: No atrial level shunt detected by color flow Doppler.  LEFT VENTRICLE PLAX 2D LVIDd:         4.10 cm      Diastology LVIDs:         3.10 cm     LV e' lateral:   9.14 cm/s LV PW:         1.10 cm     LV E/e' lateral: 7.3 LV IVS:        1.00 cm     LV e' medial:    7.29 cm/s LVOT diam:     2.10 cm     LV E/e' medial:  9.1 LV SV:         44 LV SV Index:   20 LVOT Area:     3.46 cm  LV Volumes (MOD) LV vol d, MOD A2C: 95.6 ml LV vol d, MOD A4C: 80.1 ml LV vol s, MOD A2C: 51.7 ml LV vol s, MOD A4C: 50.3 ml LV SV MOD A2C:     43.9 ml LV SV MOD A4C:  80.1 ml LV SV MOD BP:      38.4 ml RIGHT VENTRICLE RV S prime:     9.90 cm/s TAPSE (M-mode): 1.4 cm LEFT ATRIUM             Index       RIGHT ATRIUM           Index LA diam:        3.80 cm 1.70 cm/m  RA Area:     15.00 cm LA Vol (A2C):   50.7 ml 22.63 ml/m RA Volume:   33.60 ml  15.00 ml/m LA Vol (A4C):   56.3 ml 25.13 ml/m LA Biplane Vol: 54.1 ml 24.15 ml/m  AORTIC VALVE LVOT Vmax:   80.30 cm/s LVOT Vmean:  50.300 cm/s LVOT VTI:    0.127 m  AORTA Ao Root diam: 3.30 cm MITRAL VALVE               TRICUSPID VALVE MV Area (PHT): 3.72 cm    TR Peak grad:   27.5 mmHg MV Decel Time: 204 msec    TR Vmax:        262.00 cm/s MV E velocity: 66.40 cm/s MV A velocity: 33.40 cm/s  SHUNTS MV E/A ratio:  1.99        Systemic VTI:  0.13 m                            Systemic Diam: 2.10 cm Eleonore Chiquito MD Electronically signed by Eleonore Chiquito MD Signature Date/Time: 09/07/2019/4:52:01 PM    Final     Review of Systems  Unable to perform ROS: Intubated   Blood pressure 106/68, pulse 92, temperature 98.7 F (37.1 C), temperature source Axillary, resp. rate (!) 28, height 6' 1" (1.854 m), weight 102.3 kg, SpO2 100 %. Physical Exam  Nursing note and vitals reviewed. Constitutional: He appears well-developed and well-nourished.  Intubated, sedated.  Appears chronically ill.  HENT:  Head: Normocephalic and atraumatic.  Eyes: Conjunctivae are normal.  Neck: No JVD present.  Intubated  Cardiovascular: Normal rate. Exam reveals no friction rub.  No murmur heard. Irregularly  irregular  Respiratory: Breath sounds normal. He has no wheezes. He has no rales.  Anteriorly clear to auscultation  GI: Soft. Bowel sounds are normal. He exhibits no distension. There is no abdominal tenderness.  Musculoskeletal:        General: No edema.     Cervical back: Normal range of motion and neck supple.  Skin: Skin is warm and dry. There is pallor.    Assessment/Plan: 1.  Acute kidney injury: Likely secondary to ATN of sepsis versus contrast nephropathy.  Baseline creatinine around 0.8-1.0.  Nonoliguric and with profound metabolic acidosis unable to be corrected with maximal medical measures.  Begin CRRT at this time.  Overall poor prognosis noted.  We will send off repeat urinalysis and urine electrolytes. 2.  Septic shock: Likely secondary to community-acquired pneumonia.  Remains pressor dependent and on broad-spectrum antibiotics with worsening metabolic acidosis.  Associated findings of shock liver noted. 3.  Atrial fibrillation with rapid ventricular response: Likely compounded by hypotension and shock-on amiodarone. 4.  Anion gap metabolic acidosis: Secondary to acute kidney injury/septic shock.  Unresponsive to medical management, begin CRRT.  PATEL,JAY K. 23-Sep-2019, 9:43 AM

## 2019-09-13 NOTE — Progress Notes (Signed)
eLink Physician-Brief Progress Note Patient Name: Chin Wachter DOB: 09/04/1955 MRN: [id number]   Date of Service  10-03-2019  HPI/Events of Note  PH 7.12, PCO2 44.2, PO2 112  eICU Interventions  Sodium bicarbonate 2 amps iv x 1        Haroon Shatto U Brandun Pinn 2019/10/03, 6:44 AM

## 2019-09-13 NOTE — Progress Notes (Signed)
CDS called, full release Referral # [id number]

## 2019-09-13 NOTE — Progress Notes (Signed)
Cardiology Progress Note  Patient ID: Satoshi Kalas MRN: [id number] DOB: [birth year] Date of Encounter: 2019-09-28  Primary Cardiologist: Buford Dresser, MD  Subjective  On 3 pressors. In multi-organ system failure. Started CRRT. Afib controlled. Amio stopped due to liver injury.   ROS:  All other ROS reviewed and negative. Pertinent positives noted in the HPI.     Inpatient Medications  Scheduled Meds: . chlorhexidine gluconate (MEDLINE KIT)  15 mL Mouth Rinse BID  . Chlorhexidine Gluconate Cloth  6 each Topical Daily  . famotidine  20 mg Per Tube Q12H  . feeding supplement (PRO-STAT SUGAR FREE 64)  30 mL Per Tube Daily  . hydrocortisone sod succinate (SOLU-CORTEF) inj  50 mg Intravenous Q6H  . influenza vac split quadrivalent PF  0.5 mL Intramuscular Tomorrow-1000  . insulin aspart  0-20 Units Subcutaneous Q4H  . insulin glargine  10 Units Subcutaneous BID  . mouth rinse  15 mL Mouth Rinse 10 times per day  . pneumococcal 23 valent vaccine  0.5 mL Intramuscular Tomorrow-1000  . sodium chloride flush  3 mL Intravenous Q12H   Continuous Infusions: . sodium chloride Stopped (09/06/19 1714)  . sodium chloride    . azithromycin Stopped (09/07/19 1533)  . ceFEPime (MAXIPIME) IV 2 g (2019/09/28 0821)  . feeding supplement (VITAL HIGH PROTEIN) 1,000 mL (09-28-19 0823)  . fentaNYL infusion INTRAVENOUS 50 mcg/hr (2019-09-28 1435)  . heparin 10,000 units/ 20 mL infusion syringe 250 Units/hr (September 28, 2019 1207)  . norepinephrine (LEVOPHED) Adult infusion 40 mcg/min (09-28-19 1244)  . phenylephrine (NEO-SYNEPHRINE) Adult infusion 400 mcg/min (09/28/2019 0823)  . prismasol BGK 4/2.5 1,500 mL/hr at 09/28/19 1122  . propofol (DIPRIVAN) infusion 20 mcg/kg/min (September 28, 2019 1436)  .  sodium bicarbonate  infusion 1000 mL 100 mL/hr at 2019/09/28 1153  . sodium bicarbonate (isotonic) 1000 mL infusion 250 mL/hr at 09-28-19 1245  . sodium bicarbonate (isotonic) 1000 mL infusion 200 mL/hr at 09/28/19 1245    . [START ON 09/09/2019] vancomycin    . vasopressin (PITRESSIN) infusion - *FOR SHOCK* 0.03 Units/min (Sep 28, 2019 0700)   PRN Meds: sodium chloride, Place/Maintain arterial line **AND** sodium chloride, acetaminophen **OR** acetaminophen, bisacodyl, docusate, fentaNYL, heparin, heparin, heparin, midazolam, [DISCONTINUED] ondansetron **OR** ondansetron (ZOFRAN) IV   Vital Signs   Vitals:   2019-09-28 1200 09/28/2019 1231 09/28/19 1300 2019/09/28 1400  BP: 112/89 (!) 111/54 100/68 95/77  Pulse:      Resp:      Temp: 98.4 F (36.9 C) 98.2 F (36.8 C)    TempSrc: Axillary Axillary    SpO2: 98%     Weight:      Height:        Intake/Output Summary (Last 24 hours) at 09-28-2019 1507 Last data filed at 09/28/19 1400 Gross per 24 hour  Intake 13916.4 ml  Output 966 ml  Net 12950.4 ml   Last 3 Weights 09/28/2019 09/01/2019 08/30/2013  Weight (lbs) 225 lb 8.5 oz 220 lb 241 lb 10 oz  Weight (kg) 102.3 kg 99.791 kg 109.6 kg      Telemetry  Overnight telemetry shows afib, which I personally reviewed.   ECG  The most recent ECG shows Afib with RVR 158 bpm, which I personally reviewed.   Physical Exam   Vitals:   Sep 28, 2019 1200 2019-09-28 1231 2019/09/28 1300 28-Sep-2019 1400  BP: 112/89 (!) 111/54 100/68 95/77  Pulse:      Resp:      Temp: 98.4 F (36.9 C) 98.2 F (36.8 C)    TempSrc: Axillary Axillary  SpO2: 98%     Weight:      Height:         Intake/Output Summary (Last 24 hours) at October 04, 2019 1507 Last data filed at 10/04/19 1400 Gross per 24 hour  Intake 13916.4 ml  Output 966 ml  Net 12950.4 ml    Last 3 Weights 2019/10/04 09/04/2019 08/30/2013  Weight (lbs) 225 lb 8.5 oz 220 lb 241 lb 10 oz  Weight (kg) 102.3 kg 99.791 kg 109.6 kg    Body mass index is 29.76 kg/m.  General: intubated on vent Head: Atraumatic, normal size  Eyes: scleral icterus noted  Neck: Supple, no JVD Endocrine: No thryomegaly Cardiac: irregular rhythm, no m/r/g Lungs: rhonchi bilaterally  Abd:  Soft, nontender, no hepatomegaly  Ext: petechial rash, faint pulses  Musculoskeletal: No deformities Skin: cool extremities  Neuro: intubated/sedated on vent   Labs  High Sensitivity Troponin:   Recent Labs  Lab 09/06/19 1149 09/06/19 1340  TROPONINIHS 50* 48*     Cardiac EnzymesNo results for input(s): TROPONINI in the last 168 hours. No results for input(s): TROPIPOC in the last 168 hours.  Chemistry Recent Labs  Lab 09/06/19 0429 09/06/19 1503 09/07/19 0348 09/07/19 0421 09/07/19 1930 09/07/19 2018 October 04, 2019 0201 04-Oct-2019 0434 10-04-2019 0613  NA 131*   < > 132*   < > 133*   < > 134* 137 136  K 3.8   < > 3.3*   < > 4.2   < > 4.0 4.7 4.4  CL 97*   < > 96*  --  102  --   --  99  --   CO2 19*   < > 23  --  14*  --   --  15*  --   GLUCOSE 306*   < > 289*  --  236*  --   --  288*  --   BUN 22   < > 19  --  33*  --   --  45*  --   CREATININE 0.71   < > 0.77  --  1.38*  --   --  1.96*  --   CALCIUM 7.6*   < > 7.6*  --  6.9*  --   --  7.0*  --   PROT 5.8*  --  5.6*  --   --   --   --  5.0*  --   ALBUMIN 2.1*  --  2.1*  --   --   --   --  2.1*  --   AST 644*  --  358*  --   --   --   --  5,566*  --   ALT 260*  --  352*  --   --   --   --  1,235*  --   ALKPHOS 115  --  126  --   --   --   --  138*  --   BILITOT 1.5*  --  1.3*  --   --   --   --  1.9*  --   GFRNONAA >60   < > >60  --  54*  --   --  35*  --   GFRAA >60   < > >60  --  >60  --   --  41*  --   ANIONGAP 15   < > 13  --  17*  --   --  23*  --    < > = values in this  interval not displayed.    Hematology Recent Labs  Lab 09/07/19 0348 09/07/19 0421 09/07/19 1930 09/07/19 2018 09-20-19 0201 09-20-2019 0434 09/20/19 0613  WBC 24.5*  --  37.5*  --   --  41.2*  --   RBC 3.34*  --  3.11*  --   --  3.10*  --   HGB 10.8*   < > 10.0*   < > 10.9* 10.1* 10.2*  HCT 32.0*   < > 31.3*   < > 32.0* 32.1* 30.0*  MCV 95.8  --  100.6*  --   --  103.5*  --   MCH 32.3  --  32.2  --   --  32.6  --   MCHC 33.8  --  31.9  --   --   31.5  --   RDW 14.3  --  15.1  --   --  15.3  --   PLT 35*  --  36*  --   --  30*  --    < > = values in this interval not displayed.   BNPNo results for input(s): BNP, PROBNP in the last 168 hours.  DDimer  Recent Labs  Lab 08/14/2019 1616  DDIMER 3.12*     Radiology  DG Chest Port 1 View  Result Date: 09/20/2019 CLINICAL DATA:  Respiratory failure EXAM: PORTABLE CHEST 1 VIEW COMPARISON:  09/07/2019 FINDINGS: Endotracheal tube with the tip 3.8 cm above the carina. Nasogastric tube coursing below the diaphragm. Right jugular central venous catheter projecting over the cavoatrial junction. Bilateral interstitial thickening with patchy alveolar airspace opacities more focal in the upper lobes. No definite pleural effusion. No pneumothorax. Redemonstrated left posterior rib fractures. IMPRESSION: Support lines and tubing in satisfactory position. Bilateral interstitial thickening with patchy alveolar airspace opacities more focal in the upper lobes concerning for pneumonia. Electronically Signed   By: Kathreen Devoid   On: 09/20/19 08:11   Portable Chest x-ray  Result Date: 09/07/2019 CLINICAL DATA:  Endotracheal tube placement. Central line placement. EXAM: PORTABLE CHEST 1 VIEW COMPARISON:  09/10/2019 FINDINGS: Endotracheal tube has been placed and positioned 4.3 cm above the carina. Right jugular central line tip is at the SVC and right atrium junction. Nasogastric tube tip is in the proximal stomach with the sidehole in the distal esophagus. There are extensive new airspace densities in the right upper lung. There may also be new patchy airspace densities in the left upper lung and left suprahilar region. Heart size is stable. Negative for pneumothorax. Again noted are old left rib fractures. IMPRESSION: 1. Significant change in the lungs from the previous examination. Large amount of airspace disease in the right upper lung and suspect patchy airspace disease in the left upper lung. 2.  Endotracheal tube and right jugular central line are well positioned. Negative for a pneumothorax. 3. Nasogastric tube tip is in the proximal stomach. Electronically Signed   By: Markus Daft M.D.   On: 09/07/2019 11:28   DG Abd Portable 1V  Result Date: 09/07/2019 CLINICAL DATA:  NG tube placement EXAM: PORTABLE ABDOMEN - 1 VIEW COMPARISON:  None. FINDINGS: Nasogastric tube with the tip at the esophagogastric junction. Recommend advancing the nasogastric tube 14 cm. There is no bowel dilatation to suggest obstruction. There is no evidence of pneumoperitoneum, portal venous gas or pneumatosis. There are no pathologic calcifications along the expected course of the ureters. The osseous structures are unremarkable. IMPRESSION: Nasogastric tube with the tip at the esophagogastric junction. Recommend advancing the nasogastric  tube 14 cm. Electronically Signed   By: Kathreen Devoid   On: 09/07/2019 11:25   ECHOCARDIOGRAM COMPLETE  Result Date: 09/07/2019    ECHOCARDIOGRAM REPORT   Patient Name:   DAVIAN HANSHAW Date of Exam: 09/07/2019 Medical Rec #:  [id number]    Height:       73.0 in Accession #:    [id number]   Weight:       220.0 lb Date of Birth:  January 21, 1956   BSA:          2.241 m Patient Age:    64 years     BP:           86/61 mmHg Patient Gender: M            HR:           76 bpm. Exam Location:  Inpatient Procedure: 2D Echo Indications:    atrial fibrillation 427.31  History:        Patient has no prior history of Echocardiogram examinations.                 Sepsis, Arrythmias:Atrial Fibrillation; Risk Factors:Diabetes.  Sonographer:    Johny Chess Referring Phys: [place]  1. Left ventricular ejection fraction, by estimation, is 40 to 45%. The left ventricle has mildly decreased function. The left ventricle demonstrates global hypokinesis. Left ventricular diastolic parameters are consistent with Grade I diastolic dysfunction (impaired relaxation).  2. Right ventricular systolic  function is mildly reduced. The right ventricular size is normal.  3. The mitral valve is grossly normal. Mild mitral valve regurgitation. No evidence of mitral stenosis.  4. The aortic valve is tricuspid. Aortic valve regurgitation is not visualized. No aortic stenosis is present. FINDINGS  Left Ventricle: Left ventricular ejection fraction, by estimation, is 40 to 45%. The left ventricle has mildly decreased function. The left ventricle demonstrates global hypokinesis. The left ventricular internal cavity size was normal in size. There is  no left ventricular hypertrophy. Left ventricular diastolic parameters are consistent with Grade I diastolic dysfunction (impaired relaxation). Normal left ventricular filling pressure. Right Ventricle: The right ventricular size is normal. No increase in right ventricular wall thickness. Right ventricular systolic function is mildly reduced. Left Atrium: Left atrial size was normal in size. Right Atrium: Right atrial size was normal in size. Pericardium: There is no evidence of pericardial effusion. Mitral Valve: The mitral valve is grossly normal. Mild mitral valve regurgitation. No evidence of mitral valve stenosis. Tricuspid Valve: The tricuspid valve is grossly normal. Tricuspid valve regurgitation is mild . No evidence of tricuspid stenosis. Aortic Valve: The aortic valve is tricuspid. Aortic valve regurgitation is not visualized. No aortic stenosis is present. Pulmonic Valve: The pulmonic valve was grossly normal. Pulmonic valve regurgitation is not visualized. No evidence of pulmonic stenosis. Aorta: The aortic root is normal in size and structure. Venous: IVC assessment for right atrial pressure unable to be performed due to mechanical ventilation. IAS/Shunts: No atrial level shunt detected by color flow Doppler.  LEFT VENTRICLE PLAX 2D LVIDd:         4.10 cm     Diastology LVIDs:         3.10 cm     LV e' lateral:   9.14 cm/s LV PW:         1.10 cm     LV E/e' lateral:  7.3 LV IVS:        1.00 cm     LV e'  medial:    7.29 cm/s LVOT diam:     2.10 cm     LV E/e' medial:  9.1 LV SV:         44 LV SV Index:   20 LVOT Area:     3.46 cm  LV Volumes (MOD) LV vol d, MOD A2C: 95.6 ml LV vol d, MOD A4C: 80.1 ml LV vol s, MOD A2C: 51.7 ml LV vol s, MOD A4C: 50.3 ml LV SV MOD A2C:     43.9 ml LV SV MOD A4C:     80.1 ml LV SV MOD BP:      38.4 ml RIGHT VENTRICLE RV S prime:     9.90 cm/s TAPSE (M-mode): 1.4 cm LEFT ATRIUM             Index       RIGHT ATRIUM           Index LA diam:        3.80 cm 1.70 cm/m  RA Area:     15.00 cm LA Vol (A2C):   50.7 ml 22.63 ml/m RA Volume:   33.60 ml  15.00 ml/m LA Vol (A4C):   56.3 ml 25.13 ml/m LA Biplane Vol: 54.1 ml 24.15 ml/m  AORTIC VALVE LVOT Vmax:   80.30 cm/s LVOT Vmean:  50.300 cm/s LVOT VTI:    0.127 m  AORTA Ao Root diam: 3.30 cm MITRAL VALVE               TRICUSPID VALVE MV Area (PHT): 3.72 cm    TR Peak grad:   27.5 mmHg MV Decel Time: 204 msec    TR Vmax:        262.00 cm/s MV E velocity: 66.40 cm/s MV A velocity: 33.40 cm/s  SHUNTS MV E/A ratio:  1.99        Systemic VTI:  0.13 m                            Systemic Diam: 2.10 cm Eleonore Chiquito MD Electronically signed by Eleonore Chiquito MD Signature Date/Time: 09/07/2019/4:52:01 PM    Final     Cardiac Studies  TTE 09/07/2019  1. Left ventricular ejection fraction, by estimation, is 40 to 45%. The  left ventricle has mildly decreased function. The left ventricle  demonstrates global hypokinesis. Left ventricular diastolic parameters are  consistent with Grade I diastolic  dysfunction (impaired relaxation).  2. Right ventricular systolic function is mildly reduced. The right  ventricular size is normal.  3. The mitral valve is grossly normal. Mild mitral valve regurgitation.  No evidence of mitral stenosis.  4. The aortic valve is tricuspid. Aortic valve regurgitation is not  visualized. No aortic stenosis is present.   Patient Profile  Dwon Sky is a 64 y.o. male  with history of DM, tobacco abuse admitted 3/25 for septic shock c/b Afib with RVR. Course complicated by multi-organ system failure and refractory shock.   Assessment & Plan   1. Afib with RVR -admitted with pneumonia and now complicated by septic shock with ARF on CRRT, persistent metabolic acidosis, acute livery injury, profound thrombocytopenia, and cardiomyopathy.  -Off amiodarone due to liver injury -afib is a secondary process here. Possibly HUS/TTP. -poor prognosis given refractory shock.  -no strong indication to treat Afib   [facility] will sign off.   Medication Recommendations:  No need to strictly treat HRs Other recommendations (labs, testing, etc):  none  Follow up as an outpatient:  None needed  For questions or updates, please contact [place] Please consult [email or web address] for contact info under   Time Spent with Patient: I have spent a total of 35 minutes with patient reviewing hospital notes, telemetry, EKGs, labs and examining the patient as well as establishing an assessment and plan that was discussed with the patient.  > 50% of time was spent in direct patient care.    Signed, Addison Naegeli. Audie Box, [place]  2019-09-09 3:07 PM

## 2019-09-13 NOTE — Progress Notes (Addendum)
CRRT initiated at 1230, patient fluid removal set at 0 cc due to soft blood pressures. CRRT machine still pulled >400cc within a 2 hour frame and made me do a force "end of treatment".   After treatment ended patient's pressures continued to decline. A verbal order for levo was increased to 100 mcg/min by Dr. Katrinka Blazing.   Patient currently maxed on three vasopressors. Daughter made aware of poor prognosis. New orders for albumin and giapreza ordered.   Goal is to increase BP long enough to restart CRRT. CDS called and referral made.

## 2019-09-13 NOTE — Progress Notes (Signed)
PEA arrest noted at 1935, chest compressions started immediately. ACLS protocol followed.6 1 mg pushes of epinephrine given at 1936, 1939, 1945, 1948, 1950, and 1953. Bicarb amp given at 1941 and 1947, 1 gm calcium given at 1943, 2 gm magnesium given at 1952 MD at bedside at start of compressions. Patient without ROSC for 21 minutes MD called time of death at 22.

## 2019-09-13 NOTE — Progress Notes (Signed)
Pharmacy Antibiotic Note  Derek [name] is a 64 y.o. male admitted on 09/12/19 with meningitis. Pharmacy has been consulted for vancomycin and cefepime dosing now for suspected penumonia. Patient has poor prognosis not suspected to survive >24 hours.  Currently in AKI Scr 1.96, baseline 0.7. Last received 1000 mg vancomycin on 3/26 at 0130. Will reload.   Starting CRRT - will adjust antibiotic dosing  Plan: Continue Cefepime 2g IV q12h Vancomycin 2g IV x1 today, then 1000 mg IV q24hr F/u clinical progress, c/s, de-escalation, and LOT  Height: 6\' 1"  (185.4 cm) Weight: 225 lb 8.5 oz (102.3 kg) IBW/kg (Calculated) : 79.9  Temp (24hrs), Avg:99.7 F (37.6 C), Min:98.7 F (37.1 C), Max:100.9 F (38.3 C)  Recent Labs  Lab Sep 12, 2019 1616 09-12-2019 2209 09-12-19 2209 09/06/19 0018 09/06/19 0429 09/06/19 1410 09/06/19 2135 09/07/19 0348 09/07/19 1930 08/16/2019 0434 09/09/2019 0609 09/10/2019 0924  WBC  --  18.2*  --   --  19.3*  --   --  24.5* 37.5* 41.2*  --   --   CREATININE   < >  --   --   --  0.71  --  0.72 0.77 1.38* 1.96*  --   --   LATICACIDVEN  --  4.3*   < > 3.9*  --  3.4*  --   --  10.3*  --  >11.0* >11.0*   < > = values in this interval not displayed.    Estimated Creatinine Clearance: 47.9 mL/min (A) (by C-G formula based on SCr of 1.96 mg/dL (H)).    No Known Allergies  Antimicrobials this admission: 3/24 vanc >>  3/24 ceftriaxone >> 3/26 3/24 ampicillin >> 3/26 3/27 cefepime >> 3/25 Azithro>>  Dose adjustments this admission: N/A  Microbiology results: 3/24 BCx: ngtd 3/24 UCx: sent  3/25 MRSA PCR negative 3/26 resp cx: no organisms seen  Thank you for allowing pharmacy to be a part of this patient's care.  4/26, PharmD, [facility] Clinical Pharmacist Please see AMION for all Pharmacists' Contact Phone Numbers 09/06/2019, 10:14 AM

## 2019-09-13 NOTE — Progress Notes (Signed)
Wasted 250 ml of fentanyl 250 mg in 250 ml with Sherrie George.

## 2019-09-13 NOTE — Progress Notes (Signed)
Pharmacy Antibiotic Note  Derek [name] is a 64 y.o. male admitted on 08/30/2019 with meningitis. Pharmacy has been consulted for vancomycin and cefepime dosing now for suspected penumonia. Patient has poor prognosis not suspected to survive >24 hours.  Currently in AKI Scr 1.96, baseline 0.7. Last received 1000 mg vancomycin on 3/26 at 0130. Will reload and dose by levels if needing to continue.   Plan: Cefepime 2g IV q12h Vancomycin 2g IV x1 Vancomycin per unstable renal fxn F/u clinical progress, c/s, de-escalation, and LOT  Height: 6\' 1"  (185.4 cm) Weight: 225 lb 8.5 oz (102.3 kg) IBW/kg (Calculated) : 79.9  Temp (24hrs), Avg:99.5 F (37.5 C), Min:97.9 F (36.6 C), Max:100.9 F (38.3 C)  Recent Labs  Lab 08/29/2019 1616 08/25/2019 2209 09/06/19 0018 09/06/19 0429 09/06/19 1410 09/06/19 2135 09/07/19 0348 09/07/19 1930 October 05, 2019 0434 2019-10-05 0609  WBC  --  18.2*  --  19.3*  --   --  24.5* 37.5* 41.2*  --   CREATININE   < >  --   --  0.71  --  0.72 0.77 1.38* 1.96*  --   LATICACIDVEN  --  4.3* 3.9*  --  3.4*  --   --  10.3*  --  >11.0*   < > = values in this interval not displayed.    Estimated Creatinine Clearance: 47.9 mL/min (A) (by C-G formula based on SCr of 1.96 mg/dL (H)).    No Known Allergies  Antimicrobials this admission: 3/24 vanc >>  3/24 ceftriaxone >> 3/26 3/24 ampicillin >> 3/26 3/27 cefepime >>  Dose adjustments this admission: N/A  Microbiology results: 3/24 BCx: ngtd 3/24 UCx: sent  3/25 MRSA PCR negative 3/26 resp cx: no organisms seen  Thank you for allowing pharmacy to be a part of this patient's care.  4/26, PharmD PGY2 Infectious Disease Pharmacy Resident  10/05/19 7:43 AM

## 2019-09-13 NOTE — Progress Notes (Signed)
CDS referral made. Referral number is [id number]

## 2019-09-13 NOTE — Progress Notes (Signed)
   NAME:  Derek [name], MRN:  [id number], DOB:  May 31, 1956, LOS: 3 ADMISSION DATE:  08/26/2019, CONSULTATION DATE: 3/25 REFERRING MD: Dr. Antionette Char, CHIEF COMPLAINT: Sepsis  Brief History   64 yo male smoker presented with nausea, dyspnea, myalgias, fever, cough.  Found to have PNA with hypoxia and A fib with RVR.  Past Medical History  DM, PTX 2015  Significant Hospital Events   3/25 admit 3/26 add amiodarone for A fib with RVR  Consults:  Cardiology  Procedures:    Significant Diagnostic Tests:  CT head 3/25 > mild volume loss CT angio chest 3/25 >> basilar ASD, Rt effusion  Micro Data:  SARS CoV2 PCR 3/24 >> negative Influenza PCR 3/24 >> negative Blood 3/24 >> MRSA screen 3/24 >> negative HIV 3/24 >> negative Legionella Ag 3/26 >> Pneumococcal Ag 3/26 >>  Antimicrobials:  Vancomycin 3/24 >> 3/25 Rocephin 3/24 >> Ampicillin 3/24 >> 3/25 Zithromax 3/24 >> Acyclovir 3/25  Interim history/subjective:  Took a turn for the worse overnight. In multiorgan failure with profound acidemia, acute liver/kidney/respiratory failure.  Objective   Blood pressure 102/75, pulse 97, temperature 99.1 F (37.3 C), temperature source Oral, resp. rate (!) 28, height 6\' 1"  (1.854 m), weight 102.3 kg, SpO2 95 %. CVP:  [5 mmHg-18 mmHg] 18 mmHg  Vent Mode: PRVC FiO2 (%):  [100 %] 100 % Set Rate:  [20 bmp] 20 bmp Vt Set:  [630 mL] 630 mL PEEP:  [10 cmH20-14 cmH20] 14 cmH20 Plateau Pressure:  [28 cmH20-29 cmH20] 28 cmH20   Intake/Output Summary (Last 24 hours) at 10-03-2019 09/10/2019 Last data filed at 03-Oct-2019 0700 Gross per 24 hour  Intake 10223.42 ml  Output 979 ml  Net 9244.42 ml   Filed Weights   08/28/2019 1807 10/03/19 0500  Weight: 99.8 kg 102.3 kg    Examination:  GEN: ill appearing man on vent HEENT: ETT in place, minimal secretions CV: Irregular, ext warm PULM: Scattered rhonci, breathing over vent GI: Soft, hypoactive BS EXT: No edema NEURO: Withdraws to pain PSYCH:  RASS -3 SKIN: No rashes  Severe worsening of all laboratory parameters CXR pending ABG pH 7.1 despite 6 pushes of bicarb overnight and bicarb drip as well as MV in th 18LPM range Minimal UoP  Resolved Hospital Problem list   Meningitis ruled out  Assessment & Plan:  Multiorgan failure from presumed pneumonia - Broaden antibiotics to vanc/cefepime, d/c ceftriaxone, continue azithromycin - f/u culture data - Continue bicarb drip, pressors - DC amiodarone with acute liver injury - Likely will not survive next 24h, having daughter come in to discuss goals of care, remains full code at present  Best practice:  Diet: NPO DVT prophylaxis: SCDs SUP: Pepcid Mobility: bed rest Goals of care: full code Disposition: ICU    The patient is critically ill with multiple organ systems failure and requires high complexity decision making for assessment and support, frequent evaluation and titration of therapies, application of advanced monitoring technologies and extensive interpretation of multiple databases. Critical Care Time devoted to patient care services described in this note independent of APP/resident time (if applicable)  is 34 minutes.   09/10/19 MD [facility] 10/03/19 7:37 AM Personal pager: [phone] If unanswered, please page CCM On-call: [phone]

## 2019-09-13 NOTE — Death Summary Note (Signed)
NAME:  Derek [name], MRN:  [id number], DOB:  [birth year], LOS: 3 ADMISSION DATE:  2019-09-20, CONSULTATION DATE: 3/25 REFERRING MD: Dr. Myna Hidalgo, CHIEF COMPLAINT: Sepsis  Brief History   64 yo male smoker presented with nausea, dyspnea, myalgias, fever, cough.  Found to have PNA with hypoxia and A fib with RVR.   Over 3/26-3/27 the patient rapidly decompensated requiring high dose levophed, vasopressin and Giapreza with stress dose steroids and broad spectrum antibiotics.  Despite this, patient had worsening hypotension and acidosis.  He was initiated on CRRT.  Unfortunately, despite these interventions, the patient developed PEA arrest. Code blue was called and he received multiple rounds of epinephrin, bicarb, magnesium and calcium.  Despite >20 minutes of resuscitative effort, no shockable rhythm or ROSC was obtained and no pneumothorax and pericardial effusion noted with [place].  TOD 1957.  Past Medical History  DM, PTX 2015  Significant Hospital Events   3/25 admit 3/26 add amiodarone for A fib with RVR  Consults:  Cardiology  Procedures:    Significant Diagnostic Tests:  CT head 3/25 > mild volume loss CT angio chest 3/25 >> basilar ASD, Rt effusion  Micro Data:  SARS CoV2 PCR 3/24 >> negative Influenza PCR 3/24 >> negative Blood 3/24 >> MRSA screen 3/24 >> negative HIV 3/24 >> negative Legionella Ag 3/26 >> Pneumococcal Ag 3/26 >>  Antimicrobials:  Vancomycin 3/24 >> 3/25 Rocephin 3/24 >> Ampicillin 3/24 >> 3/25 Zithromax 3/24 >> Acyclovir 3/25  Interim history/subjective:  Took a turn for the worse overnight. In multiorgan failure with profound acidemia, acute liver/kidney/respiratory failure.  Objective   Blood pressure (!) 75/35, pulse 96, temperature (!) 96.3 F (35.7 C), temperature source Axillary, resp. rate (!) 30, height 6\' 1"  (1.854 m), weight 102.3 kg, SpO2 (!) 85 %. CVP:  [6 mmHg-28 mmHg] 28 mmHg  Vent Mode: PRVC FiO2 (%):  [90 %-100 %] 90 % Set Rate:   [20 bmp-28 bmp] 28 bmp Vt Set:  [630 mL] 630 mL PEEP:  [12 cmH20-14 cmH20] 14 cmH20 Plateau Pressure:  [28 cmH20-34 cmH20] 32 cmH20   Intake/Output Summary (Last 24 hours) at 09/06/2019 2010 Last data filed at 09/12/2019 1900 Gross per 24 hour  Intake 12614.13 ml  Output 635 ml  Net 11979.13 ml   Filed Weights   2019-09-20 1807 09/07/2019 0500  Weight: 99.8 kg 102.3 kg    Examination:  GEN: ill appearing man on vent HEENT: ETT in place, minimal secretions CV: Irregular, ext warm PULM: Scattered rhonci, breathing over vent GI: Soft, hypoactive BS EXT: No edema NEURO: Withdraws to pain PSYCH: RASS -3 SKIN: No rashes  Severe worsening of all laboratory parameters CXR pending ABG pH 7.1 despite 6 pushes of bicarb overnight and bicarb drip as well as MV in th 18LPM range Minimal [facility] Problem list   Meningitis ruled out  Assessment & Plan:  Multiorgan failure from presumed pneumonia, PEA arrest, CODE BLUE no ROSC obtained - On vanc/cefepime, d/c ceftriaxone, continue azithromycin - Multiple rounds of epinephrin, bicarb, stress dose steroids, mag and calcium -TOD 3/27 1957, family present at the bedside   CRITICAL CARE Performed by: Otilio Carpen Lillyth Spong   Total critical care time: 40 minutes  Critical care time was exclusive of separately billable procedures and treating other patients.  Critical care was necessary to treat or prevent imminent or life-threatening deterioration.  Critical care was time spent personally by me on the following activities: development of treatment plan with patient and/or surrogate as well  as nursing, discussions with consultants, evaluation of patient's response to treatment, examination of patient, obtaining history from patient or surrogate, ordering and performing treatments and interventions, ordering and review of laboratory studies, ordering and review of radiographic studies, pulse oximetry and re-evaluation of patient's  condition.   Derek Gasman Roza Creamer, PA-C

## 2019-09-13 DEATH — deceased

## 2020-10-30 IMAGING — DX DG CHEST 1V PORT
1 series · 1 of 1 positions shown · non-contrast
Comparison: 09/01/2013

CLINICAL DATA: Cough, fever

EXAM:
PORTABLE CHEST 1 VIEW

[chest ap]
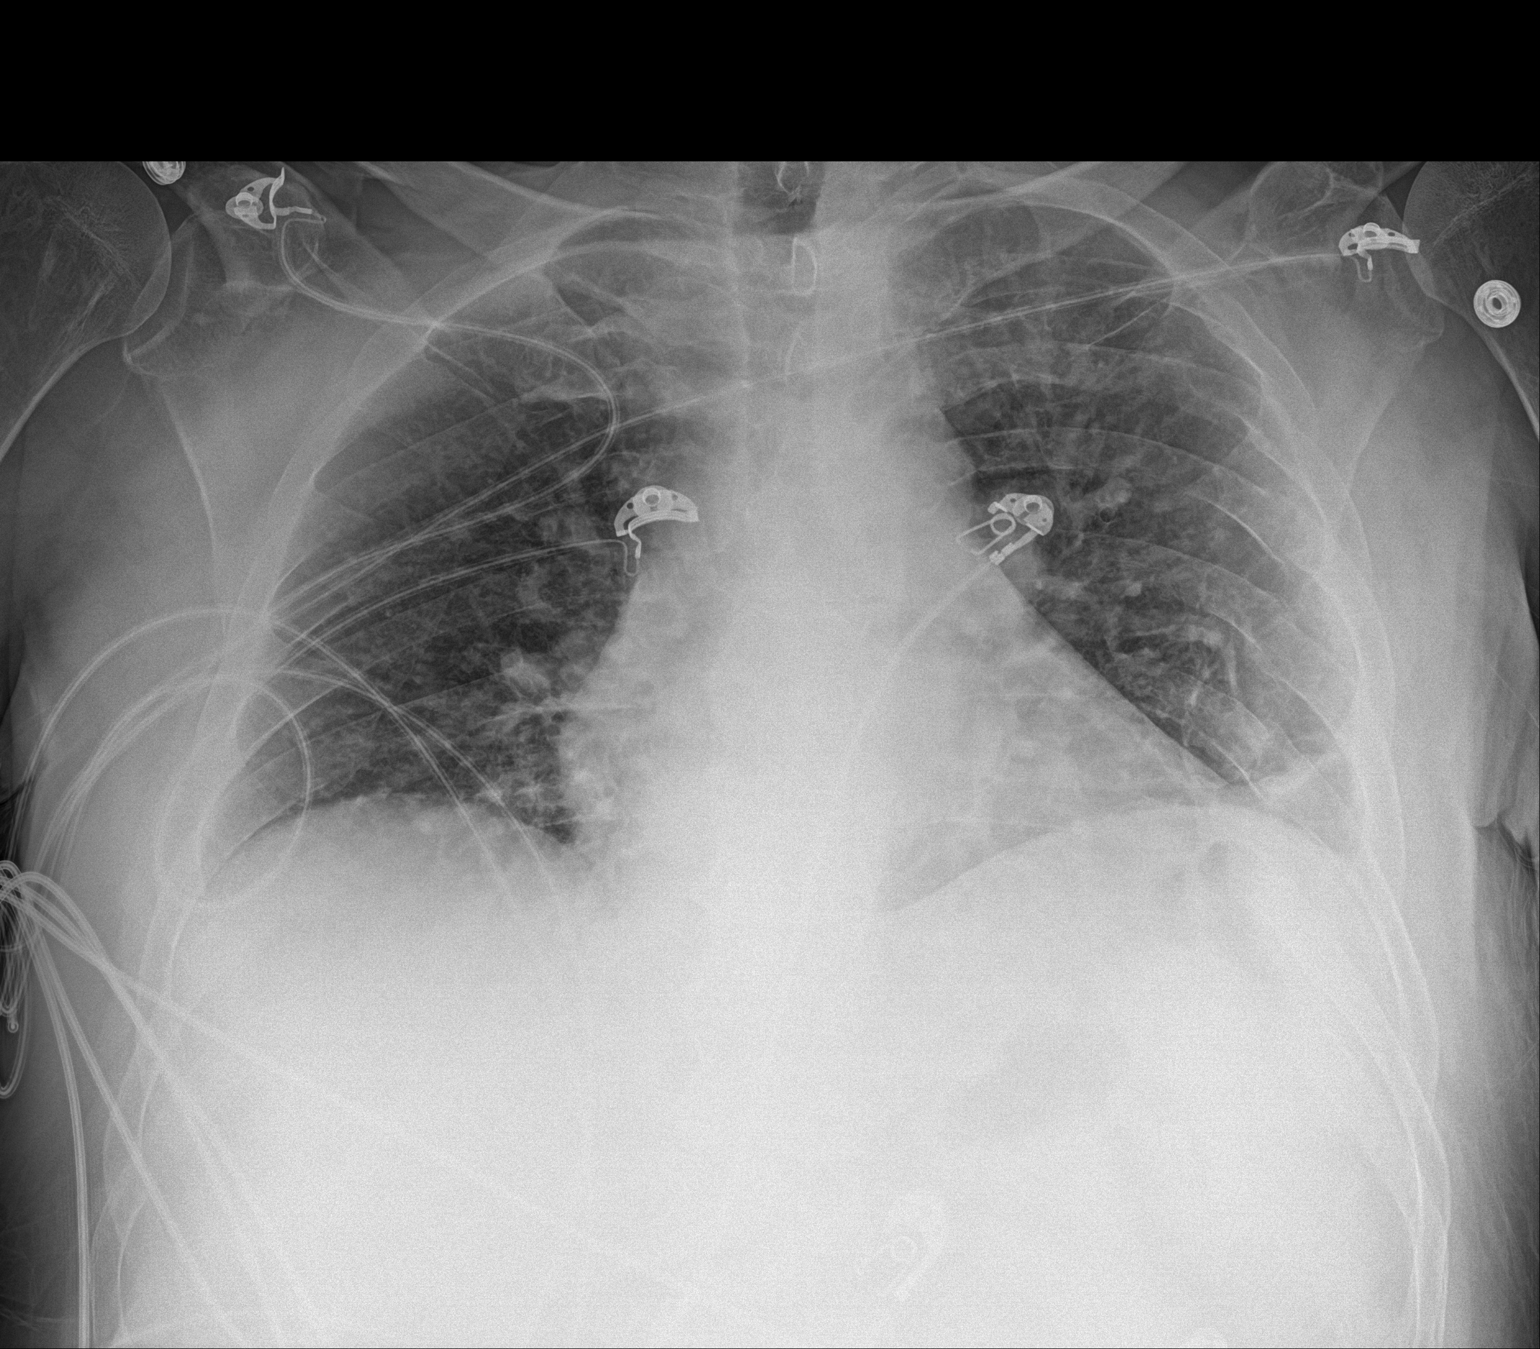

[1 of 1 positions shown; findings below may reference images not displayed]

FINDINGS: Single frontal view of the chest demonstrates a stable cardiac
silhouette. There are multiple prior healed left rib fractures.
Linear consolidation at the left lung base likely reflects scarring.
No airspace disease, effusion, or pneumothorax.
IMPRESSION: 1. No acute intrathoracic process.

## 2020-11-01 IMAGING — DX DG CHEST 1V PORT
1 series · 1 of 1 positions shown · non-contrast
Comparison: 09/05/2019

CLINICAL DATA: Endotracheal tube placement. Central line placement.

EXAM:
PORTABLE CHEST 1 VIEW

[chest ap]
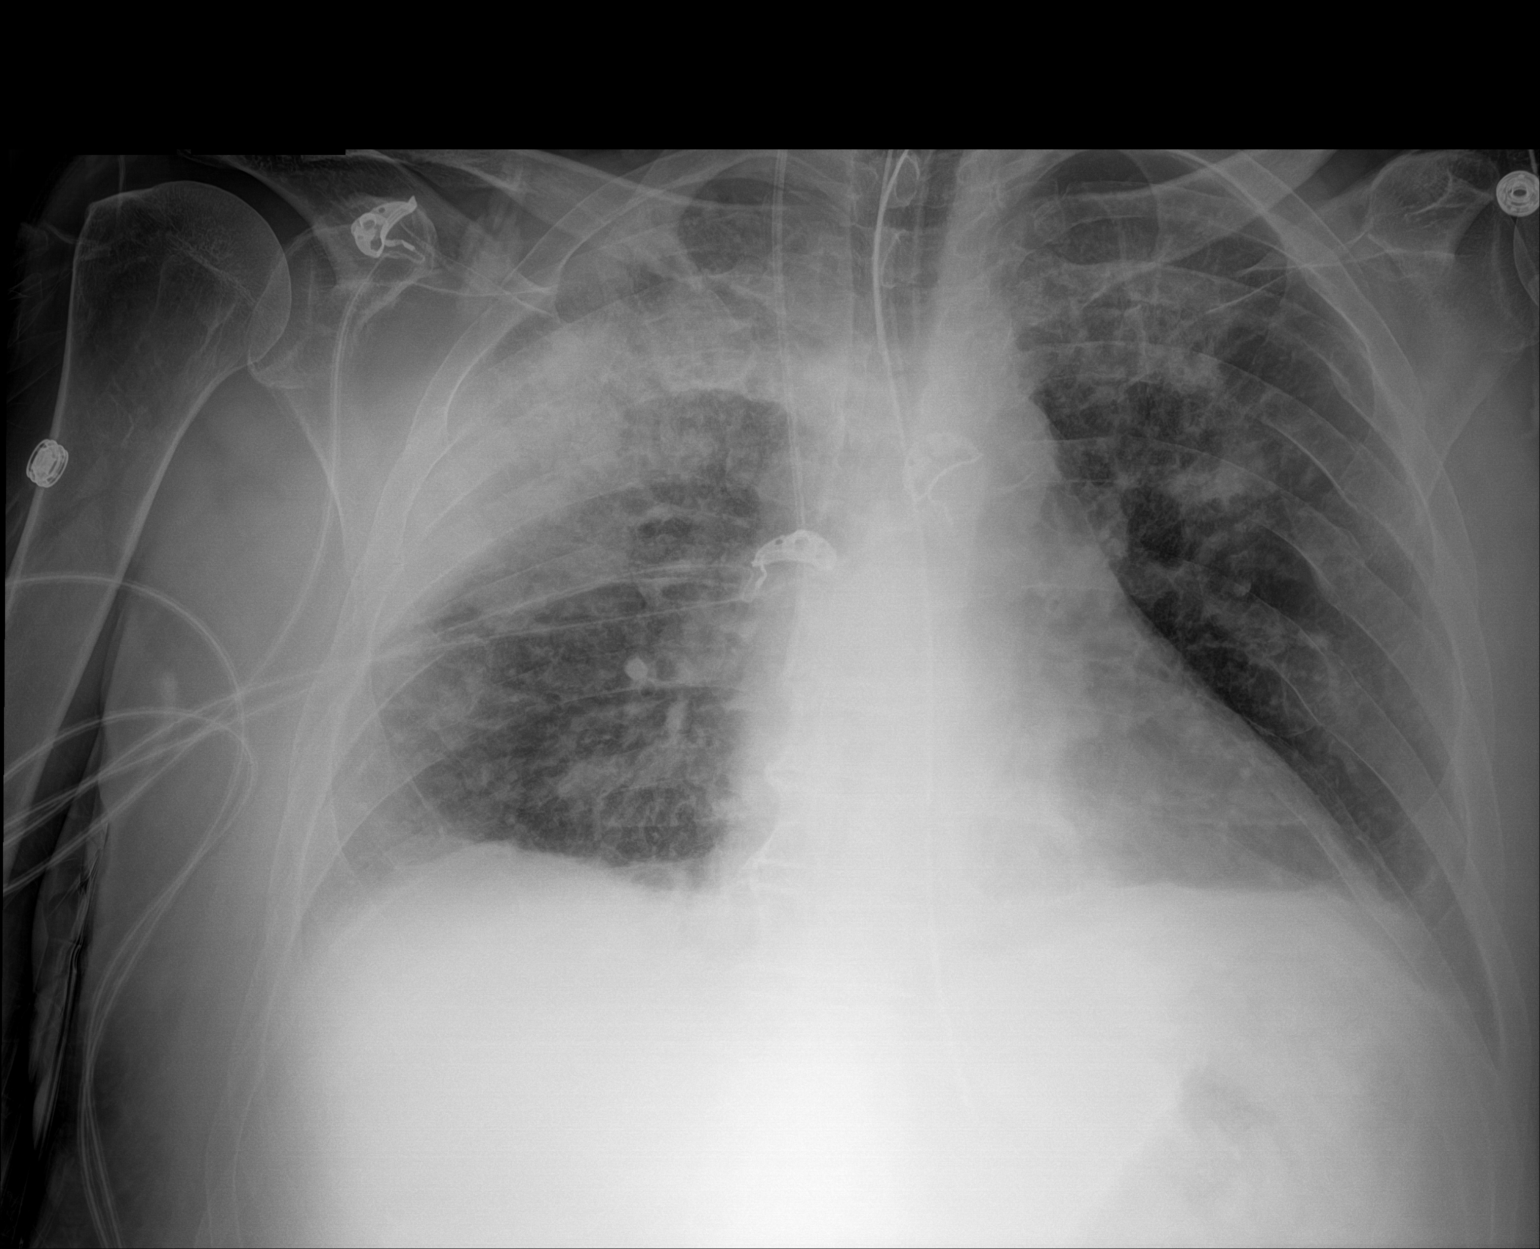

[1 of 1 positions shown; findings below may reference images not displayed]

FINDINGS: Endotracheal tube has been placed and positioned 4.3 cm above the
carina. Right jugular central line tip is at the SVC and right
atrium junction. Nasogastric tube tip is in the proximal stomach
with the sidehole in the distal esophagus. There are extensive new
airspace densities in the right upper lung. There may also be new
patchy airspace densities in the left upper lung and left suprahilar
region. Heart size is stable. Negative for pneumothorax. Again noted
are old left rib fractures.
IMPRESSION: 1. Significant change in the lungs from the previous examination.
Large amount of airspace disease in the right upper lung and suspect
patchy airspace disease in the left upper lung.
2. Endotracheal tube and right jugular central line are well
positioned. Negative for a pneumothorax.
3. Nasogastric tube tip is in the proximal stomach.
